# Patient Record
Sex: Female | Born: 1985 | Race: White | Hispanic: No | Marital: Single | Smoking: Never smoker
Health system: Southern US, Community
[De-identification: ages and names within clinical notes are randomized; demographics above are authoritative.]

## PROBLEM LIST (undated history)

## (undated) ENCOUNTER — Inpatient Hospital Stay (HOSPITAL_COMMUNITY): Payer: Self-pay

## (undated) DIAGNOSIS — S82899A Other fracture of unspecified lower leg, initial encounter for closed fracture: Secondary | ICD-10-CM

## (undated) DIAGNOSIS — J45909 Unspecified asthma, uncomplicated: Secondary | ICD-10-CM

## (undated) DIAGNOSIS — S0230XA Fracture of orbital floor, unspecified side, initial encounter for closed fracture: Secondary | ICD-10-CM

## (undated) DIAGNOSIS — S025XXA Fracture of tooth (traumatic), initial encounter for closed fracture: Secondary | ICD-10-CM

## (undated) DIAGNOSIS — G43909 Migraine, unspecified, not intractable, without status migrainosus: Secondary | ICD-10-CM

## (undated) DIAGNOSIS — R7611 Nonspecific reaction to tuberculin skin test without active tuberculosis: Secondary | ICD-10-CM

## (undated) DIAGNOSIS — Z8709 Personal history of other diseases of the respiratory system: Secondary | ICD-10-CM

## (undated) HISTORY — PX: WISDOM TOOTH EXTRACTION: SHX21

## (undated) HISTORY — PX: ANKLE FRACTURE SURGERY: SHX122

## (undated) HISTORY — DX: Nonspecific reaction to tuberculin skin test without active tuberculosis: R76.11

---
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
## 2006-09-11 ENCOUNTER — Ambulatory Visit (HOSPITAL_COMMUNITY): Admission: RE | Admit: 2006-09-11 | Discharge: 2006-09-11 | Payer: Self-pay | Admitting: Family Medicine

## 2006-09-23 ENCOUNTER — Ambulatory Visit (HOSPITAL_COMMUNITY): Admission: RE | Admit: 2006-09-23 | Discharge: 2006-09-23 | Payer: Self-pay | Admitting: Obstetrics & Gynecology

## 2006-11-30 ENCOUNTER — Inpatient Hospital Stay (HOSPITAL_COMMUNITY): Admission: AD | Admit: 2006-11-30 | Discharge: 2006-11-30 | Payer: Self-pay | Admitting: Obstetrics & Gynecology

## 2006-11-30 ENCOUNTER — Ambulatory Visit: Payer: Self-pay | Admitting: Obstetrics and Gynecology

## 2007-01-09 ENCOUNTER — Inpatient Hospital Stay (HOSPITAL_COMMUNITY): Admission: AD | Admit: 2007-01-09 | Discharge: 2007-01-09 | Payer: Self-pay | Admitting: Gynecology

## 2007-02-15 ENCOUNTER — Ambulatory Visit: Payer: Self-pay | Admitting: Obstetrics & Gynecology

## 2007-02-17 ENCOUNTER — Inpatient Hospital Stay (HOSPITAL_COMMUNITY): Admission: AD | Admit: 2007-02-17 | Discharge: 2007-02-19 | Payer: Self-pay | Admitting: Obstetrics & Gynecology

## 2007-02-17 ENCOUNTER — Ambulatory Visit: Payer: Self-pay | Admitting: Family

## 2007-03-25 ENCOUNTER — Inpatient Hospital Stay (HOSPITAL_COMMUNITY): Admission: EM | Admit: 2007-03-25 | Discharge: 2007-03-27 | Payer: Self-pay | Admitting: Emergency Medicine

## 2007-09-12 ENCOUNTER — Emergency Department (HOSPITAL_COMMUNITY): Admission: EM | Admit: 2007-09-12 | Discharge: 2007-09-13 | Payer: Self-pay | Admitting: Emergency Medicine

## 2008-06-16 ENCOUNTER — Emergency Department (HOSPITAL_BASED_OUTPATIENT_CLINIC_OR_DEPARTMENT_OTHER): Admission: EM | Admit: 2008-06-16 | Discharge: 2008-06-16 | Payer: Self-pay | Admitting: Emergency Medicine

## 2008-06-18 ENCOUNTER — Ambulatory Visit: Payer: Self-pay | Admitting: Diagnostic Radiology

## 2008-06-18 ENCOUNTER — Emergency Department (HOSPITAL_BASED_OUTPATIENT_CLINIC_OR_DEPARTMENT_OTHER): Admission: EM | Admit: 2008-06-18 | Discharge: 2008-06-18 | Payer: Self-pay | Admitting: Emergency Medicine

## 2008-08-13 ENCOUNTER — Emergency Department (HOSPITAL_COMMUNITY): Admission: EM | Admit: 2008-08-13 | Discharge: 2008-08-13 | Payer: Self-pay | Admitting: Emergency Medicine

## 2008-08-15 ENCOUNTER — Emergency Department (HOSPITAL_BASED_OUTPATIENT_CLINIC_OR_DEPARTMENT_OTHER): Admission: EM | Admit: 2008-08-15 | Discharge: 2008-08-15 | Payer: Self-pay | Admitting: Emergency Medicine

## 2008-08-15 ENCOUNTER — Ambulatory Visit: Payer: Self-pay | Admitting: Diagnostic Radiology

## 2008-09-01 ENCOUNTER — Emergency Department (HOSPITAL_BASED_OUTPATIENT_CLINIC_OR_DEPARTMENT_OTHER): Admission: EM | Admit: 2008-09-01 | Discharge: 2008-09-01 | Payer: Self-pay | Admitting: Emergency Medicine

## 2009-01-06 ENCOUNTER — Emergency Department (HOSPITAL_BASED_OUTPATIENT_CLINIC_OR_DEPARTMENT_OTHER): Admission: EM | Admit: 2009-01-06 | Discharge: 2009-01-06 | Payer: Self-pay | Admitting: Emergency Medicine

## 2009-01-06 ENCOUNTER — Ambulatory Visit: Payer: Self-pay | Admitting: Diagnostic Radiology

## 2009-04-20 ENCOUNTER — Emergency Department (HOSPITAL_COMMUNITY): Admission: EM | Admit: 2009-04-20 | Discharge: 2009-04-20 | Payer: Self-pay | Admitting: Emergency Medicine

## 2009-07-02 ENCOUNTER — Ambulatory Visit: Payer: Self-pay | Admitting: Radiology

## 2009-07-02 ENCOUNTER — Emergency Department (HOSPITAL_BASED_OUTPATIENT_CLINIC_OR_DEPARTMENT_OTHER): Admission: EM | Admit: 2009-07-02 | Discharge: 2009-07-02 | Payer: Self-pay | Admitting: Emergency Medicine

## 2010-04-22 LAB — URINALYSIS, ROUTINE W REFLEX MICROSCOPIC
Bilirubin Urine: NEGATIVE
Glucose, UA: NEGATIVE mg/dL
Ketones, ur: NEGATIVE mg/dL
Protein, ur: NEGATIVE mg/dL

## 2010-04-22 LAB — DIFFERENTIAL
Basophils Absolute: 0.1 10*3/uL (ref 0.0–0.1)
Basophils Relative: 1 % (ref 0–1)
Eosinophils Absolute: 0.1 10*3/uL (ref 0.0–0.7)
Eosinophils Relative: 1 % (ref 0–5)
Lymphocytes Relative: 27 % (ref 12–46)

## 2010-04-22 LAB — CBC
HCT: 40.9 % (ref 36.0–46.0)
MCHC: 34.2 g/dL (ref 30.0–36.0)
Platelets: 302 10*3/uL (ref 150–400)
RDW: 12.5 % (ref 11.5–15.5)

## 2010-04-22 LAB — PREGNANCY, URINE: Preg Test, Ur: NEGATIVE

## 2010-04-22 LAB — HEMOCCULT GUIAC POC 1CARD (OFFICE): Fecal Occult Bld: NEGATIVE

## 2010-04-27 LAB — URINALYSIS, ROUTINE W REFLEX MICROSCOPIC
Bilirubin Urine: NEGATIVE
Glucose, UA: NEGATIVE mg/dL
Hgb urine dipstick: NEGATIVE
Ketones, ur: NEGATIVE mg/dL
Protein, ur: NEGATIVE mg/dL
pH: 6 (ref 5.0–8.0)

## 2010-04-27 LAB — URINE MICROSCOPIC-ADD ON

## 2010-04-30 LAB — URINALYSIS, ROUTINE W REFLEX MICROSCOPIC
Ketones, ur: NEGATIVE mg/dL
Protein, ur: NEGATIVE mg/dL
Urobilinogen, UA: 1 mg/dL (ref 0.0–1.0)

## 2010-04-30 LAB — URINE MICROSCOPIC-ADD ON

## 2010-04-30 LAB — RAPID STREP SCREEN (MED CTR MEBANE ONLY): Streptococcus, Group A Screen (Direct): POSITIVE — AB

## 2010-06-04 NOTE — H&P (Signed)
[name], Munoz              ACCOUNT NO.:  [id number]   MEDICAL RECORD NO.:  [id number]          PATIENT TYPE:  EMS   LOCATION:  ED                           FACILITY:  [facility]   PHYSICIAN:  Lonia Blood, M.D.DATE OF BIRTH:  11/23/1985   DATE OF ADMISSION:  03/24/2007  DATE OF DISCHARGE:                              HISTORY & PHYSICAL   CHIEF COMPLAINT:  Intractable vomiting, diarrhea and orthostasis.   HISTORY OF PRESENT ILLNESS:  Helen Munoz is a pleasant, 25-year-  old who is status post spontaneous vaginal delivery of a healthy child  approximately 1 month ago.  She has been doing well in the post delivery  period.  For the past 24 hours, however, she has developed the acute-  onset of intractable nausea, vomiting and watery diarrhea.  She states  that she has vomited in excess of 10 times in the last 24 hours.  There  has been no hematemesis.  The patient complains of diffuse abdominal  cramps, but no focal abdominal pain.  Diarrhea is described as almost  pure water with no hematochezia or melena.  There has been no  appreciated fever, per her report.  There has been no shortness of  breath, chest pain or headache.  The patient does state that she feels  dizzy when she stands up abruptly.  There has been no syncope.  There  have been no sick contacts to the patient's knowledge.  She has had no  previous similar episodes.   REVIEW OF SYSTEMS:  Comprehensive review of systems is unremarkable with  exception of the multiple positive elements noted in the history of  present illness above.   PAST MEDICAL HISTORY:  1. Asthma.  2. Status post wisdom tooth extraction.  3. History of abnormal Pap smear.  4. History of gonorrhea, Chlamydia and Trichomonas vaginitis.  5. History of migraine headache.  6. Status post vaginal delivery 1 month ago.  7. Depression with history of suicide attempt requiring inpatient      hospitalization.   MEDICATIONS:  None.   ALLERGIES:  No known drug allergies.   FAMILY HISTORY:  Noncontributory to this admission.   SOCIAL HISTORY:  The patient does not smoke.  She does not drink.  She  lives in the [place] area.   LABORATORY DATA AND X-RAY FINDINGS:  BMET is unremarkable.  CBC is  unremarkable.  Urine pregnancy test is negative.  Urinalysis reveals  trace leukocyte esterase and 0-2 wbc's.   PHYSICAL EXAMINATION:  VITAL SIGNS:  Temperature 100.9, blood pressure  105/44, heart rate 142, respiratory 22, 97% on room air.  After 4 L of  fluid, blood pressure is actually 84/39.  GENERAL:  Well-developed, well-nourished, obese female in no acute  respiratory distress.  HEENT:  Normocephalic, atraumatic.  Pupils equal round, react to light  accommodation.  Extraocular muscles intact bilaterally.  NECK:  No JVD.  LUNGS:  Clear to auscultation bilaterally without wheezes or rhonchi.  CARDIOVASCULAR:  Regular rate and rhythm, but tachycardic without gallop  or rub.  ABDOMEN:  Diffusely tender to palpation with no focal tenderness.  No  mass.  No rebound or ascites.  Nondistended.  EXTREMITIES:  No significant cyanosis, clubbing, edema of bilateral  lower extremities.  NEUROLOGIC:  Nonfocal neurologic exam.   IMPRESSION/PLAN:  1. Probable viral gastroenteritis.  Helen Munoz is suffering with what      sounds like a typical viral gastroenteritis.  All efforts have been      made to treat her and discharge her from the emergency room.      Despite 4 L total of bodily fluid, however, the patient remains      orthostatic and hypotensive.  She is therefore being admitted for      aggressive intravenous fluid volume resuscitation with crystalloid      intravenous fluids.  We will treat the patient's symptoms and      follow her clinical course.  2. Asthma.  No active complications.  3. Recent vaginal delivery.  The patient reports no complications.      Lonia Blood, M.D.  Electronically Signed      JTM/MEDQ  D:  03/25/2007  T:  03/25/2007  Job:  [id number]

## 2010-06-07 NOTE — H&P (Signed)
[facility]  Patient:    KILLIAN, RESS Visit Number: [id number] MRN: [id number]          Service Type: PSY Location: 100 0100 01 Attending Physician:  Veneta Penton. Dictated by:   Veneta Penton, M.D. Admit Date:  04/27/2001                     Psychiatric Admission Assessment  DATE OF ADMISSION:  April 27, 2001.  REASON FOR ADMISSION:  This 25 year old white female was admitted complaining of depression, status post overdose with 24 Advil as a suicide attempt.  HISTORY OF PRESENT ILLNESS:  The patient complained of an increasingly depressed, irritable, and angry mood most of the day nearly every day, increasing over the past several months, along with anhedonia, decreased hygiene, decreased school performance and decreased appetite.  She admits to feelings of hopelessness, helplessness, worthlessness, decreased concentration and energy level, increased symptoms of fatigue, and recurrent thoughts of death.  She is unable to contract for safety at this time.  She denies any psychosocial stressors other than frequent arguments with her mother.  PAST PSYCHIATRIC HISTORY:  Significant for attention deficit hyperactivity disorder and oppositional-defiant disorder.  DRUG AND ALCOHOL ABUSE HISTORY:  She denies any use of tobacco, street drugs or alcohol.  PAST MEDICAL HISTORY:  Significant for a fractured right wrist at age 65.  She has no known drug allergies or sensitivities.   She is currently taking no medication.  STRENGTHS AND ASSETS:  Her mother is supportive of her.  FAMILY AND SOCIAL HISTORY:  The patient lives with her mother.  Father is an alcoholic and continues to actively drink.  The patient is currently in the 8th grade and doing poorly.  MENTAL STATUS EXAMINATION:  The patient presents as well-developed, well- nourished adolescent white female, who is alert, oriented x 4, cooperative with the evaluation and whose  appearance is compatible with her stated age. Her affect and mood are depressed and irritable.  Her concentration is decreased.  She displays poor impulse control, decreased concentration and attention span.  She is easily distracted by extraneous stimuli.  Her immediate immediate recall, short term memory and remote memory are intact. Similarities and differences are within normal limits and she is able to abstract the proverbs.  Her thought process are goal directed.  ADMISSION DIAGNOSES: Axis I:    1. Major depression, single episode, severe, without psychosis.            2. Attention deficit hyperactivity disorder, combined type. Axis II:   Rule out learning disorder not otherwise specified. Axis III:  None. Axis IV:   Severe. Axis V:    Code 20.  FURTHER EVALUATION AND TREATMENT RECOMMENDATIONS:  ESTIMATED LENGTH OF STAY ON THE INPATIENT UNIT:  Five to seven days.  INITIAL DISCHARGE PLAN:  To discharge the patient to home.  INITIAL PLAN OF CARE:  To begin the patient on a trial of Effexor XR to attempt her symptoms of depression as well as her ADHD symptoms. Psychotherapy will focus on improving the patients impulse control, decreasing cognitive distortions and potential for self harm.  A laboratory workup will also be initiated to rule out any other medical problems contributing to her symptomatology. Dictated by:   Veneta Penton, M.D. Attending Physician:  Veneta Penton DD:  04/28/01 TD:  04/28/01 Job: [id number]

## 2010-06-07 NOTE — Discharge Summary (Signed)
Helen Munoz, Helen Munoz              ACCOUNT NO.:  [id number]   MEDICAL RECORD NO.:  [id number]          PATIENT TYPE:  INP   LOCATION:  1335                         FACILITY:  [facility]   PHYSICIAN:  Mobolaji B. Bakare, M.D.DATE OF BIRTH:  09-Sep-1985   DATE OF ADMISSION:  03/24/2007  DATE OF DISCHARGE:  03/27/2007                               DISCHARGE SUMMARY   PRIMARY CARE PHYSICIAN:  Unassigned.   FINAL DIAGNOSES:  1. Acute gastroenteritis, likely viral.  2. Hypotension.   BRIEF HISTORY:  Helen Munoz is a 25 year old lady who presented with  intractable nausea, vomiting, and diarrhea.  She was orthostatic and  hypotensive at the time of admission.  The patient was seen in the  emergency room with a temperature of 100.9, blood pressure 105/44,  tachycardic with a heart rate of 142.  She was saturating at 95% on room  air.  The patient has had several episodes of nausea, vomiting, and  diarrhea in the preceding 24 hours.  She was given IV fluids in the  emergency room and resuscitated aggressively.  Blood pressure improved,  and patient was admitted for further observation.   Patient was started on a clear-liquid diet.  This was subsequently  advanced to a regular diet.  She tolerated it quite well.  The patient  was stable enough for discharge on April 27, 2007.   DISCHARGE CONDITION:  Stable.  Blood pressure 109/69, O2 sats 95% on  room air.   DISCHARGE MEDICATIONS:  None.   Patient was advised to drink lots of water to replace the fluid loss.      Mobolaji B. Corky Downs, M.D.  Electronically Signed     MBB/MEDQ  D:  05/04/2007  T:  05/04/2007  Job:  [id number]

## 2010-06-07 NOTE — Discharge Summary (Signed)
[facility]  Patient:    Helen Munoz, Helen Munoz Visit Number: [id number] MRN: [id number]          Service Type: PSY Location: 100 0100 01 Attending Physician:  Veneta Penton. Dictated by:   Veneta Penton, M.D. Admit Date:  04/27/2001 Discharge Date: 05/05/2001                             Discharge Summary  REASON FOR ADMISSION:  This 25 year old white female was admitted complaining of depression status post overdose with 24 Advil as a suicide attempt.  For further history of present illness, please see the patients psychiatric admission assessment.  PHYSICAL EXAMINATION:  At the time of admission was significant for obesity and a history of allergic rhinitis.  She had an otherwise unremarkable physical examination.  LABORATORY EXAMINATION:  The patient underwent a laboratory work-up to rule out any other medical problems contributing to her symptomatology.  A free T4 and TSH were within normal limits.  Hepatic panel was within normal limits.  A GGT was within normal limits.  A UA showed a large amount of hemoglobin consistent with the patient having her monthly menstrual flow.  A urine probe for gonorrhea and chlamydia were negative.  An RPR was nonreactive.  Basic metabolic panel was within normal limits.  CBC showed a white count of 17.5 thousand with a left shift on admission.  The patient received no x-rays, no special procedures, no additional consultations.  She sustained no complications during the course of this hospitalization.  HOSPITAL COURSE:  During the course of the hospitalization, the patient had an episode of flu syndrome consistent with her elevated white count on admission. She recovered from this after a period of 48 hours and at the present time is afebrile and without any complaints of flu symptoms.  On admission, her affect and mood were depressed and irritable.  Her concentration was decreased.  She displayed poor  impulse control, decreased concentration and attention span and was easily distracted by extraneous stimuli.  She was begun on a trial of Effexor XR, complained of some difficulty with insomnia to which trazodone was added.  She tolerated these medications well without side effects.  At the time of discharge, she is actively participating in all aspects of the therapeutic treatment program, denies any suicidal or homicidal ideation.  She is motivated for outpatient therapy and consequently is felt to have reached her maximum benefits of hospitalization and is ready for discharge to a less restricted alternative setting.  CONDITION ON DISCHARGE:  Improved  FINAL DIAGNOSIS: Axis I:    1. Major depression, single episode, severe, without psychosis.            2. Attention deficit hyperactivity disorder, combined type. Axis II:   Rule out learning disorder not otherwise specified. Axis III:  Obesity. Axis IV:   Severe. Axis V:    Code 20 on admission, code 30 on discharge.  FURTHER EVALUATION AND TREATMENT RECOMMENDATIONS: 1. The patient is discharged to home. 2. She is discharged on an unrestricted level of activity and a regular diet. 3. She will follow up with her outpatient psychiatrist for all further aspects    of her psychiatric care and consequently I will sign off on the case at    this time.  DISCHARGE MEDICATIONS: 1. Trazodone 50 mg p.o. q.h.s. 2. Effexor XR 75 mg p.o. q.a.m. Dictated by:   Veneta Penton, M.D.  Attending Physician:  Veneta Penton DD:  05/05/01 TD:  05/05/01 Job: [id number]

## 2010-09-28 ENCOUNTER — Emergency Department (HOSPITAL_BASED_OUTPATIENT_CLINIC_OR_DEPARTMENT_OTHER)
Admission: EM | Admit: 2010-09-28 | Discharge: 2010-09-28 | Disposition: A | Discharge status: Home / Self Care | Payer: Self-pay | Attending: Emergency Medicine | Admitting: Emergency Medicine

## 2010-09-28 ENCOUNTER — Encounter: Payer: Self-pay | Admitting: Emergency Medicine

## 2010-09-28 DIAGNOSIS — H60399 Other infective otitis externa, unspecified ear: Secondary | ICD-10-CM | POA: Insufficient documentation

## 2010-09-28 DIAGNOSIS — H9209 Otalgia, unspecified ear: Secondary | ICD-10-CM | POA: Insufficient documentation

## 2010-09-28 MED ORDER — NEOMYCIN-POLYMYXIN-HC 3.5-10000-1 OT SUSP: 3.0000 [drp] | Freq: Four times a day (QID) | OTIC | Status: AC

## 2010-09-28 NOTE — ED Notes (Signed)
Pt reports bleeding from R Ear denies any trauma

## 2010-09-28 NOTE — ED Notes (Signed)
Care plan reviewed with use of meds

## 2010-09-28 NOTE — ED Provider Notes (Signed)
History   16y F with R ear pain. Mild ache in R ear for past 2d. Today noticed something wet in ear and appeared to be blood. Placed qtip in ear and noticed blood. Denies trauma. No fever or chills. No change in hearing. No tinnittus. Denies hx of diabetes. No other complaints.  CSN: [id number] Arrival date & time: 09/28/2010 11:40 AM  Chief Complaint  Patient presents with  . Otalgia    Pt reports bleeding from R ear x 1 Mod amount of pain   The history is provided by the patient. No language interpreter was used.    History reviewed. No pertinent past medical history.  History reviewed. No pertinent past surgical history.  History reviewed. No pertinent family history.  History  Substance Use Topics  . Smoking status: Never Smoker   . Smokeless tobacco: Not on file  . Alcohol Use: No    OB History    Grav Para Term Preterm Abortions TAB SAB Ect Mult Living                  Review of Systems  Constitutional: Negative for fever and chills.  HENT: Positive for ear pain and ear discharge. Negative for hearing loss, nosebleeds, congestion, sneezing, neck pain and neck stiffness.   Eyes: Negative.   Respiratory: Negative.   Cardiovascular: Negative.   Gastrointestinal: Negative.   Musculoskeletal: Negative.   Neurological: Negative for dizziness, light-headedness and headaches.  All other systems reviewed and are negative.    Physical Exam  BP 110/60  Pulse 77  Temp(Src) 98.3 F (36.8 C) (Oral)  Resp 18  SpO2 96%  Physical Exam  HENT:  Left Ear: External ear normal.  Nose: Nose normal.  Mouth/Throat: Oropharynx is clear and moist.       Proximal-to-mid R external auditory canal macerated with what appearing to dried blood. TM easily visualized and appearing grossly normal without perf or fluid. L ear exam normal.  Eyes: Conjunctivae are normal. Pupils are equal, round, and reactive to light.  Neck: Normal range of motion. Neck supple.  Cardiovascular: Normal  rate, regular rhythm and normal heart sounds.   Pulmonary/Chest: Effort normal and breath sounds normal. No respiratory distress.  Lymphadenopathy:    She has no cervical adenopathy.  Neurological: She is alert.  Skin: Skin is warm and dry.  Psychiatric: She has a normal mood and affect.    ED Course  Procedures  MDM 25yF with exam consistent w/ R otitis externa. CInically appears well otherwise.  Plan otic abx. Discussed signs/symptoms to return for immediate re-eval.     Raeford Razor, MD 09/30/10 1040

## 2010-10-10 LAB — RH IMMUNE GLOB WKUP(>/=20WKS)(NOT WOMEN'S HOSP): Fetal Screen: NEGATIVE

## 2010-10-10 LAB — CBC
HCT: 29.6 — ABNORMAL LOW
Hemoglobin: 10.3 — ABNORMAL LOW
Hemoglobin: 9.1 — ABNORMAL LOW
MCHC: 34.7
MCV: 85.7
MCV: 87.2
Platelets: 224
RBC: 3 — ABNORMAL LOW
RDW: 14
RDW: 14.1
WBC: 14.8 — ABNORMAL HIGH

## 2010-10-14 LAB — DIFFERENTIAL
Eosinophils Absolute: 0
Eosinophils Relative: 0
Lymphocytes Relative: 15
Lymphs Abs: 1.4
Monocytes Absolute: 0.3
Monocytes Relative: 3

## 2010-10-14 LAB — CBC
HCT: 36.9
Hemoglobin: 12.1
MCV: 82.7
RBC: 4.47
WBC: 9.7

## 2010-10-14 LAB — URINALYSIS, ROUTINE W REFLEX MICROSCOPIC
Bilirubin Urine: NEGATIVE
Glucose, UA: NEGATIVE
Hgb urine dipstick: NEGATIVE
Nitrite: NEGATIVE
Specific Gravity, Urine: 1.03
pH: 6

## 2010-10-14 LAB — CLOSTRIDIUM DIFFICILE EIA

## 2010-10-14 LAB — PREGNANCY, URINE: Preg Test, Ur: NEGATIVE

## 2010-10-14 LAB — STOOL CULTURE

## 2010-10-14 LAB — BASIC METABOLIC PANEL
BUN: 15
CO2: 22
Calcium: 7.6 — ABNORMAL LOW
Chloride: 105
Creatinine, Ser: 0.68
GFR calc Af Amer: >60
GFR calc non Af Amer: >60
Glucose, Bld: 95
Potassium: 3.9
Sodium: 135

## 2010-10-14 LAB — URINE MICROSCOPIC-ADD ON

## 2010-10-14 LAB — FECAL LACTOFERRIN, QUANT

## 2010-10-25 LAB — URINALYSIS, ROUTINE W REFLEX MICROSCOPIC
Bilirubin Urine: NEGATIVE
Hgb urine dipstick: NEGATIVE
Protein, ur: 30 — AB
Urobilinogen, UA: 0.2

## 2010-10-25 LAB — STREP B DNA PROBE

## 2010-10-29 LAB — URINALYSIS, ROUTINE W REFLEX MICROSCOPIC
Ketones, ur: NEGATIVE
Nitrite: NEGATIVE
Protein, ur: NEGATIVE

## 2010-10-29 LAB — WET PREP, GENITAL

## 2010-10-29 LAB — KLEIHAUER-BETKE STAIN
Fetal Cells %: 0
Quantitation Fetal Hemoglobin: 0

## 2011-01-10 ENCOUNTER — Emergency Department (HOSPITAL_BASED_OUTPATIENT_CLINIC_OR_DEPARTMENT_OTHER)
Admission: EM | Admit: 2011-01-10 | Discharge: 2011-01-11 | Disposition: A | Discharge status: Home / Self Care | Payer: Medicaid Other | Attending: Emergency Medicine | Admitting: Emergency Medicine

## 2011-01-10 ENCOUNTER — Encounter (HOSPITAL_BASED_OUTPATIENT_CLINIC_OR_DEPARTMENT_OTHER): Payer: Self-pay | Admitting: *Deleted

## 2011-01-10 DIAGNOSIS — A499 Bacterial infection, unspecified: Secondary | ICD-10-CM | POA: Insufficient documentation

## 2011-01-10 DIAGNOSIS — N920 Excessive and frequent menstruation with regular cycle: Secondary | ICD-10-CM | POA: Insufficient documentation

## 2011-01-10 DIAGNOSIS — N760 Acute vaginitis: Secondary | ICD-10-CM | POA: Insufficient documentation

## 2011-01-10 DIAGNOSIS — B9689 Other specified bacterial agents as the cause of diseases classified elsewhere: Secondary | ICD-10-CM | POA: Insufficient documentation

## 2011-01-10 NOTE — ED Notes (Signed)
Co left and right lower sided abdominal pains and low back pain for approx 1 week. Constant in nature. C/o urinary frequency. C/o vaginal discharge-mucus in description. Also c/o moderate bleeding that began same time with small clots. States she has not bled in 6 years d/t bc method.

## 2011-01-11 LAB — URINALYSIS, ROUTINE W REFLEX MICROSCOPIC
Bilirubin Urine: NEGATIVE
Nitrite: NEGATIVE
Protein, ur: NEGATIVE mg/dL
Specific Gravity, Urine: 1.024 (ref 1.005–1.030)
Urobilinogen, UA: 1 mg/dL (ref 0.0–1.0)

## 2011-01-11 LAB — URINE MICROSCOPIC-ADD ON

## 2011-01-11 LAB — WET PREP, GENITAL: Yeast Wet Prep HPF POC: NONE SEEN

## 2011-01-11 MED ORDER — METRONIDAZOLE 500 MG PO TABS: 500.0000 mg | ORAL_TABLET | Freq: Two times a day (BID) | ORAL | Status: AC

## 2011-01-11 MED ORDER — TRAMADOL HCL 50 MG PO TABS: 50.0000 mg | ORAL_TABLET | Freq: Four times a day (QID) | ORAL | Status: AC | PRN

## 2011-01-11 NOTE — ED Provider Notes (Signed)
History     CSN: [id number]  Arrival date & time 01/10/11  2316   First MD Initiated Contact with Patient 01/11/11 0000      Chief Complaint  Patient presents with  . Abdominal Pain    (Consider location/radiation/quality/duration/timing/severity/associated sxs/prior treatment) Patient is a 25 y.o. female presenting with abdominal pain. The history is provided by the patient. No language interpreter was used.  Abdominal Pain The primary symptoms of the illness include abdominal pain, vaginal discharge and vaginal bleeding. The primary symptoms of the illness do not include fever, fatigue, shortness of breath, nausea, vomiting, diarrhea, hematemesis, hematochezia or dysuria. The current episode started more than 2 days ago (9 days ago). The onset of the illness was gradual. The problem has not changed since onset. The abdominal pain is located in the suprapubic region. The abdominal pain does not radiate. The severity of the abdominal pain is 9/10. The abdominal pain is relieved by nothing. Exacerbated by: nothing.  The vaginal discharge is not associated with dysuria.  Associated with: nothing. The patient states that she believes she is currently not pregnant. The patient has not had a change in bowel habit. Risk factors: none. Symptoms associated with the illness do not include chills, anorexia, diaphoresis, heartburn, constipation, urgency, hematuria, frequency or back pain. Significant associated medical issues do not include PUD or diverticulitis.  Does not know why she started bleeding a week ago because she has implanted contraception in her left forearm.  She has had a device for 6 years.  It was changed in May and she never previously had bleeding.  Discharge is clear and colorless.    History reviewed. No pertinent past medical history.  History reviewed. No pertinent past surgical history.  No family history on file.  History  Substance Use Topics  . Smoking status: Never  Smoker   . Smokeless tobacco: Not on file  . Alcohol Use: No    OB History    Grav Para Term Preterm Abortions TAB SAB Ect Mult Living                  Review of Systems  Constitutional: Negative for fever, chills, diaphoresis and fatigue.  HENT: Negative for facial swelling.   Respiratory: Negative for shortness of breath.   Cardiovascular: Negative for chest pain.  Gastrointestinal: Positive for abdominal pain. Negative for heartburn, nausea, vomiting, diarrhea, constipation, hematochezia, anorexia and hematemesis.  Genitourinary: Positive for vaginal bleeding and vaginal discharge. Negative for dysuria, urgency, frequency, hematuria and difficulty urinating.  Musculoskeletal: Negative for back pain.  Neurological: Negative for dizziness.  Hematological: Negative.   Psychiatric/Behavioral: Negative.     Allergies  Review of patient's allergies indicates no known allergies.  Home Medications   Current Outpatient Rx  Name Route Sig Dispense Refill  . METRONIDAZOLE 500 MG PO TABS Oral Take 1 tablet (500 mg total) by mouth 2 (two) times daily. 14 tablet 0  . TRAMADOL HCL 50 MG PO TABS Oral Take 1 tablet (50 mg total) by mouth every 6 (six) hours as needed for pain. Maximum dose= 8 tablets per day 9 tablet 0    BP 111/62  Pulse 90  Temp(Src) 98.4 F (36.9 C) (Oral)  Resp 18  Ht 5\' 4"  (1.626 m)  Wt 200 lb (90.719 kg)  BMI 34.33 kg/m2  SpO2 100%  LMP 01/10/2011  Physical Exam  Constitutional: She is oriented to person, place, and time. She appears well-developed and well-nourished.  HENT:  Head: Normocephalic and atraumatic.  Mouth/Throat: Oropharynx is clear and moist. No oropharyngeal exudate.  Eyes: Conjunctivae are normal. Pupils are equal, round, and reactive to light.  Neck: Normal range of motion. Neck supple.  Cardiovascular: Normal rate and regular rhythm.   Pulmonary/Chest: Effort normal and breath sounds normal.  Abdominal: Soft. Bowel sounds are normal.  She exhibits no mass. There is no tenderness. There is no rebound and no guarding.  Genitourinary: Cervix exhibits no motion tenderness. Right adnexum displays no mass and no tenderness. Left adnexum displays no mass and no tenderness.       Chaperone present.  Scant bleeding per cervical os os closed  Musculoskeletal: Normal range of motion. She exhibits no edema.  Lymphadenopathy:    She has no cervical adenopathy.  Neurological: She is alert and oriented to person, place, and time.  Skin: Skin is warm and dry. No rash noted.  Psychiatric: Thought content normal.    ED Course  Procedures (including critical care time)  Labs Reviewed  URINALYSIS, ROUTINE W REFLEX MICROSCOPIC - Abnormal; Notable for the following:    Hgb urine dipstick SMALL (*)    All other components within normal limits  WET PREP, GENITAL - Abnormal; Notable for the following:    Clue Cells, Wet Prep MODERATE (*)    WBC, Wet Prep HPF POC TOO NUMEROUS TO COUNT (*)    All other components within normal limits  PREGNANCY, URINE  URINE MICROSCOPIC-ADD ON  GC/CHLAMYDIA PROBE AMP, GENITAL   No results found.   1. Bacterial vaginosis   2. Menorrhagia       MDM  Patient advised she will be called with positive gonorrhea or chlamydia results.  Rx given for pain and bacterial vaginosis.  She will need to follow up with her gynecologist regarding hormone levels and break through bleeding on implanted contraception and will need her annual pap smear.  Patient verbalizes understanding and agrees to follow up         Jakya Dovidio Smitty Cords, MD 01/11/11 [phone]

## 2011-01-13 LAB — GC/CHLAMYDIA PROBE AMP, GENITAL: GC Probe Amp, Genital: NEGATIVE

## 2011-01-14 NOTE — ED Notes (Signed)
+  Chlamydia Chart sent to EDP office for review.  

## 2011-01-17 NOTE — ED Notes (Signed)
Rx for Doxycyline 100 mg BID x 7 days.Follow up with PCP per Felicie Morn.

## 2011-01-22 NOTE — ED Notes (Signed)
Unable to contact via phone. Letter faxed to epic address.

## 2011-08-27 ENCOUNTER — Encounter (HOSPITAL_BASED_OUTPATIENT_CLINIC_OR_DEPARTMENT_OTHER): Payer: Self-pay

## 2011-08-27 ENCOUNTER — Emergency Department (HOSPITAL_BASED_OUTPATIENT_CLINIC_OR_DEPARTMENT_OTHER)
Admission: EM | Admit: 2011-08-27 | Discharge: 2011-08-27 | Disposition: A | Discharge status: Home / Self Care | Payer: BC Managed Care – PPO | Attending: Emergency Medicine | Admitting: Emergency Medicine

## 2011-08-27 DIAGNOSIS — N938 Other specified abnormal uterine and vaginal bleeding: Secondary | ICD-10-CM | POA: Insufficient documentation

## 2011-08-27 DIAGNOSIS — N949 Unspecified condition associated with female genital organs and menstrual cycle: Secondary | ICD-10-CM | POA: Insufficient documentation

## 2011-08-27 LAB — URINALYSIS, ROUTINE W REFLEX MICROSCOPIC
Bilirubin Urine: NEGATIVE
Hgb urine dipstick: NEGATIVE
Ketones, ur: NEGATIVE mg/dL
Protein, ur: NEGATIVE mg/dL
Urobilinogen, UA: 0.2 mg/dL (ref 0.0–1.0)

## 2011-08-27 LAB — PREGNANCY, URINE: Preg Test, Ur: NEGATIVE

## 2011-08-27 LAB — WET PREP, GENITAL

## 2011-08-27 MED ORDER — MEDROXYPROGESTERONE ACETATE 5 MG PO TABS: 5.0000 mg | ORAL_TABLET | Freq: Every day | ORAL | Status: DC

## 2011-08-27 MED ORDER — TRAMADOL HCL 50 MG PO TABS: 50.0000 mg | ORAL_TABLET | Freq: Four times a day (QID) | ORAL | Status: AC | PRN

## 2011-08-27 NOTE — ED Provider Notes (Signed)
Medical screening examination/treatment/procedure(s) were performed by non-physician practitioner and as supervising physician I was immediately available for consultation/collaboration.   Carleene Cooper III, MD 08/27/11 2209

## 2011-08-27 NOTE — ED Provider Notes (Signed)
History     CSN: [id number]  Arrival date & time 08/27/11  1602   First MD Initiated Contact with Patient 08/27/11 1802      Chief Complaint  Patient presents with  . Abdominal Pain  . Vaginal Bleeding    (Consider location/radiation/quality/duration/timing/severity/associated sxs/prior treatment) HPI  26 y/o female INAD c/o vaginal bleeding x14 days at 3 pads per days with lower midline abdominal cramping at about 8/10 on the pain scale, moderate relief with Motrin.Implanon replaced in 04/210, pt has not menstruated regularly in last 6 years. Patient denies any other abnormal vaginal discharge, any change in bowel or bladder habits, lightheaded sensation on standing shortness of breath or palpitations.   History reviewed. No pertinent past medical history.  History reviewed. No pertinent past surgical history.  No family history on file.  History  Substance Use Topics  . Smoking status: Never Smoker   . Smokeless tobacco: Not on file  . Alcohol Use: No    OB History    Grav Para Term Preterm Abortions TAB SAB Ect Mult Living                  Review of Systems  Gastrointestinal: Positive for abdominal pain.  Genitourinary: Positive for menstrual problem.  All other systems reviewed and are negative.    Allergies  Review of patient's allergies indicates no known allergies.  Home Medications   Current Outpatient Rx  Name Route Sig Dispense Refill  . BUTALBITAL-ACETAMINOPHEN 50-325 MG PO TABS Oral Take 1 tablet by mouth every 6 (six) hours.      BP 122/71  Pulse 78  Temp 98.1 F (36.7 C) (Oral)  Resp 16  Ht 5\' 3"  (1.6 m)  Wt 223 lb (101.152 kg)  BMI 39.50 kg/m2  SpO2 96%  LMP 08/27/2011  Physical Exam  Nursing note and vitals reviewed. Constitutional: She is oriented to person, place, and time. She appears well-developed and well-nourished. No distress.  HENT:  Head: Normocephalic.  Eyes: Conjunctivae and EOM are normal.  Cardiovascular: Normal  rate.   Pulmonary/Chest: Effort normal.  Abdominal: Soft. Bowel sounds are normal. She exhibits no distension and no mass. There is tenderness. There is no rebound and no guarding.       Mild tenderness to deep palpation of suprapubic area  Genitourinary: No labial fusion. There is no rash, tenderness, lesion or injury on the right labia. There is no rash, tenderness, lesion or injury on the left labia. Uterus is not tender. Cervix exhibits discharge. Cervix exhibits no motion tenderness and no friability. Right adnexum displays no mass, no tenderness and no fullness. Left adnexum displays no mass and no tenderness. There is bleeding around the vagina. No erythema or tenderness around the vagina. No foreign body around the vagina. No signs of injury around the vagina. No vaginal discharge found.       Scant dark blood from the os and pooled in the posterior fourchette.   Musculoskeletal: Normal range of motion.  Neurological: She is alert and oriented to person, place, and time.  Psychiatric: She has a normal mood and affect.    ED Course  Procedures (including critical care time)  Labs Reviewed  WET PREP, GENITAL - Abnormal; Notable for the following:    WBC, Wet Prep HPF POC FEW (*)     All other components within normal limits  URINALYSIS, ROUTINE W REFLEX MICROSCOPIC  PREGNANCY, URINE  GC/CHLAMYDIA PROBE AMP, GENITAL   No results found.   1. Dysfunctional  uterine bleeding       MDM  Patient presenting with moderate vaginal bleeding for 14 days. Patient is taking Implanon for birth control. Urinalysis and urine pregnancy tests are unremarkable. Pelvic exam is clinically unremarkable. No signs of pelvic inflammatory disease or cervicitis.  Wet prep is unremarkable except for scant white blood cells. I will control bleeding with Provera 10 mg daily for 5 days I will encourage the patient to followup with her GYN.  Pt verbalized understanding and agrees with care plan. Outpatient  follow-up and return precautions given.         Wynetta Emery, PA-C 08/27/11 1931

## 2011-08-27 NOTE — ED Notes (Signed)
The pelvic cart is set up at the bedside and ready for the doctors use.

## 2011-08-27 NOTE — ED Notes (Signed)
Pt reports onset of abdominal cramping and vaginal bleeding x 2 weeks.

## 2011-08-28 LAB — GC/CHLAMYDIA PROBE AMP, GENITAL: GC Probe Amp, Genital: NEGATIVE

## 2012-01-22 ENCOUNTER — Emergency Department (HOSPITAL_BASED_OUTPATIENT_CLINIC_OR_DEPARTMENT_OTHER): Payer: BC Managed Care – PPO

## 2012-01-22 ENCOUNTER — Encounter (HOSPITAL_BASED_OUTPATIENT_CLINIC_OR_DEPARTMENT_OTHER): Payer: Self-pay | Admitting: *Deleted

## 2012-01-22 DIAGNOSIS — Y929 Unspecified place or not applicable: Secondary | ICD-10-CM | POA: Insufficient documentation

## 2012-01-22 DIAGNOSIS — Y939 Activity, unspecified: Secondary | ICD-10-CM | POA: Insufficient documentation

## 2012-01-22 DIAGNOSIS — IMO0002 Reserved for concepts with insufficient information to code with codable children: Secondary | ICD-10-CM | POA: Insufficient documentation

## 2012-01-22 DIAGNOSIS — S6990XA Unspecified injury of unspecified wrist, hand and finger(s), initial encounter: Secondary | ICD-10-CM | POA: Insufficient documentation
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
## 2012-01-23 ENCOUNTER — Emergency Department (HOSPITAL_BASED_OUTPATIENT_CLINIC_OR_DEPARTMENT_OTHER)
Admission: EM | Admit: 2012-01-23 | Discharge: 2012-01-23 | Discharge status: Against Medical Advice | Payer: BC Managed Care – PPO | Attending: Emergency Medicine | Admitting: Emergency Medicine

## 2012-01-23 NOTE — ED Notes (Signed)
Pt called to be taken to room but did not answer.

## 2012-05-30 ENCOUNTER — Emergency Department (HOSPITAL_COMMUNITY)
Admission: EM | Admit: 2012-05-30 | Discharge: 2012-05-30 | Disposition: A | Discharge status: Home / Self Care | Payer: Medicaid Other | Attending: Emergency Medicine | Admitting: Emergency Medicine

## 2012-05-30 ENCOUNTER — Other Ambulatory Visit (HOSPITAL_COMMUNITY): Payer: BC Managed Care – PPO

## 2012-05-30 ENCOUNTER — Encounter (HOSPITAL_COMMUNITY): Payer: Self-pay

## 2012-05-30 ENCOUNTER — Emergency Department (HOSPITAL_COMMUNITY): Payer: Medicaid Other

## 2012-05-30 DIAGNOSIS — S0181XA Laceration without foreign body of other part of head, initial encounter: Secondary | ICD-10-CM

## 2012-05-30 DIAGNOSIS — S51809A Unspecified open wound of unspecified forearm, initial encounter: Secondary | ICD-10-CM | POA: Insufficient documentation

## 2012-05-30 DIAGNOSIS — S82892A Other fracture of left lower leg, initial encounter for closed fracture: Secondary | ICD-10-CM

## 2012-05-30 DIAGNOSIS — S0093XA Contusion of unspecified part of head, initial encounter: Secondary | ICD-10-CM

## 2012-05-30 DIAGNOSIS — S51009A Unspecified open wound of unspecified elbow, initial encounter: Secondary | ICD-10-CM | POA: Insufficient documentation

## 2012-05-30 DIAGNOSIS — S51811A Laceration without foreign body of right forearm, initial encounter: Secondary | ICD-10-CM

## 2012-05-30 DIAGNOSIS — S022XXA Fracture of nasal bones, initial encounter for closed fracture: Secondary | ICD-10-CM

## 2012-05-30 DIAGNOSIS — S82899A Other fracture of unspecified lower leg, initial encounter for closed fracture: Secondary | ICD-10-CM

## 2012-05-30 DIAGNOSIS — Y92410 Unspecified street and highway as the place of occurrence of the external cause: Secondary | ICD-10-CM | POA: Insufficient documentation

## 2012-05-30 DIAGNOSIS — S0230XA Fracture of orbital floor, unspecified side, initial encounter for closed fracture: Secondary | ICD-10-CM

## 2012-05-30 DIAGNOSIS — S025XXA Fracture of tooth (traumatic), initial encounter for closed fracture: Secondary | ICD-10-CM

## 2012-05-30 DIAGNOSIS — Y9389 Activity, other specified: Secondary | ICD-10-CM | POA: Insufficient documentation

## 2012-05-30 DIAGNOSIS — F1010 Alcohol abuse, uncomplicated: Secondary | ICD-10-CM | POA: Insufficient documentation

## 2012-05-30 DIAGNOSIS — Z3202 Encounter for pregnancy test, result negative: Secondary | ICD-10-CM | POA: Insufficient documentation

## 2012-05-30 DIAGNOSIS — S1093XA Contusion of unspecified part of neck, initial encounter: Secondary | ICD-10-CM | POA: Insufficient documentation

## 2012-05-30 DIAGNOSIS — T148XXA Other injury of unspecified body region, initial encounter: Secondary | ICD-10-CM | POA: Insufficient documentation

## 2012-05-30 DIAGNOSIS — S023XXA Fracture of orbital floor, initial encounter for closed fracture: Secondary | ICD-10-CM

## 2012-05-30 DIAGNOSIS — F10929 Alcohol use, unspecified with intoxication, unspecified: Secondary | ICD-10-CM

## 2012-05-30 DIAGNOSIS — S0180XA Unspecified open wound of other part of head, initial encounter: Secondary | ICD-10-CM | POA: Insufficient documentation

## 2012-05-30 DIAGNOSIS — R4182 Altered mental status, unspecified: Secondary | ICD-10-CM | POA: Insufficient documentation

## 2012-05-30 DIAGNOSIS — S51012A Laceration without foreign body of left elbow, initial encounter: Secondary | ICD-10-CM

## 2012-05-30 DIAGNOSIS — S0003XA Contusion of scalp, initial encounter: Secondary | ICD-10-CM | POA: Insufficient documentation

## 2012-05-30 HISTORY — DX: Other fracture of unspecified lower leg, initial encounter for closed fracture: S82.899A

## 2012-05-30 HISTORY — DX: Fracture of orbital floor, unspecified side, initial encounter for closed fracture: S02.30XA

## 2012-05-30 HISTORY — DX: Fracture of tooth (traumatic), initial encounter for closed fracture: S02.5XXA

## 2012-05-30 LAB — URINALYSIS, ROUTINE W REFLEX MICROSCOPIC
Glucose, UA: NEGATIVE mg/dL
Ketones, ur: NEGATIVE mg/dL
Leukocytes, UA: NEGATIVE
Specific Gravity, Urine: 1.01 (ref 1.005–1.030)
pH: 5.5 (ref 5.0–8.0)

## 2012-05-30 LAB — RAPID URINE DRUG SCREEN, HOSP PERFORMED
Amphetamines: NOT DETECTED
Benzodiazepines: NOT DETECTED
Cocaine: NOT DETECTED
Opiates: NOT DETECTED

## 2012-05-30 LAB — CBC
HCT: 39.7 % (ref 36.0–46.0)
Hemoglobin: 13.3 g/dL (ref 12.0–15.0)
MCV: 88 fL (ref 78.0–100.0)
RDW: 12.9 % (ref 11.5–15.5)
WBC: 15.9 10*3/uL — ABNORMAL HIGH (ref 4.0–10.5)

## 2012-05-30 LAB — COMPREHENSIVE METABOLIC PANEL
ALT: 18 U/L (ref 0–35)
Albumin: 4.1 g/dL (ref 3.5–5.2)
Alkaline Phosphatase: 71 U/L (ref 39–117)
BUN: 8 mg/dL (ref 6–23)
Potassium: 3.3 mEq/L — ABNORMAL LOW (ref 3.5–5.1)
Sodium: 138 mEq/L (ref 135–145)
Total Protein: 8 g/dL (ref 6.0–8.3)

## 2012-05-30 LAB — POCT I-STAT, CHEM 8
BUN: 7 mg/dL (ref 6–23)
Calcium, Ion: 1.15 mmol/L (ref 1.12–1.23)
Creatinine, Ser: 1 mg/dL (ref 0.50–1.10)
Glucose, Bld: 148 mg/dL — ABNORMAL HIGH (ref 70–99)
TCO2: 23 mmol/L (ref 0–100)

## 2012-05-30 LAB — ETHANOL: Alcohol, Ethyl (B): 143 mg/dL — ABNORMAL HIGH (ref 0–11)

## 2012-05-30 MED ORDER — IOHEXOL 300 MG/ML  SOLN
100.0000 mL | Freq: Once | INTRAMUSCULAR | Status: AC | PRN
  Administered 2012-05-30: 100 mL via INTRAVENOUS

## 2012-05-30 MED ORDER — HYDROCODONE-ACETAMINOPHEN 5-325 MG PO TABS: 1.0000 | ORAL_TABLET | Freq: Four times a day (QID) | ORAL | Status: DC | PRN

## 2012-05-30 MED ORDER — FENTANYL CITRATE 0.05 MG/ML IJ SOLN
50.0000 ug | Freq: Once | INTRAMUSCULAR | Status: AC
  Administered 2012-05-30: 50 ug via INTRAVENOUS
  Filled 2012-05-30: qty 2

## 2012-05-30 MED ORDER — MORPHINE SULFATE 4 MG/ML IJ SOLN
4.0000 mg | Freq: Once | INTRAMUSCULAR | Status: AC
  Administered 2012-05-30: 4 mg via INTRAVENOUS
  Filled 2012-05-30: qty 1

## 2012-05-30 MED ORDER — ONDANSETRON HCL 4 MG/2ML IJ SOLN
4.0000 mg | Freq: Once | INTRAMUSCULAR | Status: AC
  Administered 2012-05-30: 4 mg via INTRAVENOUS
  Filled 2012-05-30: qty 2

## 2012-05-30 NOTE — ED Notes (Signed)
Patient transported to CT/Xray. 

## 2012-05-30 NOTE — Progress Notes (Signed)
Orthopedic Tech Progress Note Patient Details:  Helen Munoz Jun 06, 1985 [id number] Ortho Tech called to apply ankle air cast splint. Nurse stated Tech said he did not know how to put air cast splint on. Ortho Tech arrived to ED to apply air cast splint and educate Nurse Tech on how to apply. Nurse Tech no where to be found in ED. Spoke with nurse and she stated patient is not being discharged soon. Nurse asked to page Ortho Tech back when Nurse Tech comes back so air cast application can be demonstrated and explained.  Patient ID: Helen Munoz, female   DOB: Nov 07, 1985, 27 y.o.   MRN: [id number]   Helen Munoz 05/30/2012, 1:10 PM

## 2012-05-30 NOTE — ED Notes (Signed)
Ortho tech paged & notified of need for splint.

## 2012-05-30 NOTE — ED Provider Notes (Signed)
L posterior upper arm laceration repaired by me.  LACERATION REPAIR Performed by: Fayrene Helper Authorized byFayrene Helper Consent: Verbal consent obtained. Risks and benefits: risks, benefits and alternatives were discussed Consent given by: patient Patient identity confirmed: provided demographic data Prepped and Draped in normal sterile fashion Wound explored  Laceration Location: L posterior distal upper arm laceration  Laceration Length: 9 cm  Multiple small debrides were seen and palpated.    Anesthesia: local infiltration  Local anesthetic: lidocaine 2% w epinephrine  Anesthetic total: 30 ml  Irrigation method: syringe Amount of cleaning: extensive  Sharp debridement with sterile scissors, approximation of skin.  Skin closure: 4.0  Number of sutures: 15  Technique: simple interrupted  Patient tolerance: Patient tolerated the procedure well with no immediate complications.   Fayrene Helper, PA-C 05/30/12 1411
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
## 2012-05-30 NOTE — ED Provider Notes (Signed)
1:23 PM LACERATION REPAIR Performed by: Emilia Beck Authorized by: Emilia Beck Consent: Verbal consent obtained. Risks and benefits: risks, benefits and alternatives were discussed Consent given by: patient Patient identity confirmed: provided demographic data Prepped and Draped in normal sterile fashion Wound explored  Laceration Location: frontal, central hairline  Laceration Length: 1 cm  No Foreign Bodies seen or palpated  Anesthesia: local infiltration  Local anesthetic: lidocaine 2% with epinephrine  Anesthetic total: 1 ml  Irrigation method: syringe Amount of cleaning: standard  Skin closure: 5-0 prolene  Number of sutures: 1  Technique: simple   LACERATION REPAIR Performed by: Emilia Beck Authorized by: Emilia Beck Consent: Verbal consent obtained. Risks and benefits: risks, benefits and alternatives were discussed Consent given by: patient Patient identity confirmed: provided demographic data Prepped and Draped in normal sterile fashion Wound explored  Laceration Location: right volar forearm  Laceration Length: 0.5 cm  No Foreign Bodies seen or palpated  Anesthesia: local infiltration  Local anesthetic: lidocaine 2 % with epinephrine  Anesthetic total: 1 ml  Irrigation method: syringe Amount of cleaning: standard  Skin closure: 5-0 prolene  Number of sutures: 1  Technique: simple  Patient tolerance: Patient tolerated the procedure well with no immediate complications.   Patient tolerance: Patient tolerated the procedure well with no immediate complications.   Emilia Beck, PA-C 05/30/12 1324

## 2012-05-30 NOTE — ED Notes (Signed)
Crutches given, instructed on use by Tresa Endo, RN.  Ambulated using crutches within room B & to bathroom with some assistance
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
## 2012-05-30 NOTE — ED Notes (Signed)
Ankle splint air cast applied
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
## 2012-05-30 NOTE — Progress Notes (Signed)
Orthopedic Tech Progress Note Patient Details:  Helen Munoz Jun 19, 1985 [id number] Went back to check on patient/see if Nurse Tech was around for application education after seeing other patients. Ankle air cast splint had been applied by that point. Attending ED doctor applying sutures to patient. Patient ID: Helen Munoz, female   DOB: 1985-11-25, 27 y.o.   MRN: [id number]   Orie Rout 05/30/2012, 1:35 PM

## 2012-06-01 NOTE — ED Provider Notes (Signed)
Medical screening examination/treatment/procedure(s) were conducted as a shared visit with non-physician practitioner(s) and myself.  I personally evaluated the patient during the encounter See H and P.   Chelbi Herber E Pablo Mathurin, MD 06/01/12 1122 

## 2012-06-01 NOTE — ED Provider Notes (Signed)
Medical screening examination/treatment/procedure(s) were conducted as a shared visit with non-physician practitioner(s) and myself.  I personally evaluated the patient during the encounter See H and P.   Suzi Roots, MD 06/01/12 1122

## 2012-06-07 ENCOUNTER — Encounter (HOSPITAL_COMMUNITY): Payer: Self-pay | Admitting: Emergency Medicine

## 2012-06-07 ENCOUNTER — Emergency Department (HOSPITAL_COMMUNITY)
Admission: EM | Admit: 2012-06-07 | Discharge: 2012-06-07 | Disposition: A | Discharge status: Home / Self Care | Payer: Medicaid Other | Attending: Emergency Medicine | Admitting: Emergency Medicine

## 2012-06-07 DIAGNOSIS — Z4802 Encounter for removal of sutures: Secondary | ICD-10-CM | POA: Insufficient documentation

## 2012-06-07 NOTE — ED Notes (Signed)
Sutures intact forehead, left elbow and right forearm. Appear to be healing adequately.
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
## 2012-06-07 NOTE — ED Notes (Signed)
Here for suture removal in both arms and  head

## 2012-06-08 NOTE — ED Provider Notes (Signed)
Medical screening examination/treatment/procedure(s) were performed by non-physician practitioner and as supervising physician I was immediately available for consultation/collaboration.  Ottie Neglia, MD 06/08/12 0749 

## 2012-06-22 ENCOUNTER — Emergency Department (INDEPENDENT_AMBULATORY_CARE_PROVIDER_SITE_OTHER)
Admission: EM | Admit: 2012-06-22 | Discharge: 2012-06-22 | Disposition: A | Discharge status: Home / Self Care | Payer: Self-pay | Source: Home / Self Care | Attending: Family Medicine | Admitting: Family Medicine

## 2012-06-22 ENCOUNTER — Encounter (HOSPITAL_COMMUNITY): Payer: Self-pay | Admitting: Emergency Medicine

## 2012-06-22 DIAGNOSIS — Z4802 Encounter for removal of sutures: Secondary | ICD-10-CM

## 2012-06-22 NOTE — ED Notes (Signed)
Patient in department for suture removal from left elbow

## 2012-06-22 NOTE — ED Provider Notes (Signed)
History     CSN: [id number]  Arrival date & time 06/22/12  1340   First MD Initiated Contact with Patient 06/22/12 1400      Chief Complaint  Patient presents with  . Suture / Staple Removal    (Consider location/radiation/quality/duration/timing/severity/associated sxs/prior treatment) Patient is a 27 y.o. female presenting with suture removal. The history is provided by the patient.  Suture / Staple Removal This is a new problem. The current episode started more than 1 week ago (5/11 seen in ER for lac, here for suture removal).    History reviewed. No pertinent past medical history.  History reviewed. No pertinent past surgical history.  No family history on file.  History  Substance Use Topics  . Smoking status: Never Smoker   . Smokeless tobacco: Not on file  . Alcohol Use: Yes    OB History   Grav Para Term Preterm Abortions TAB SAB Ect Mult Living                  Review of Systems  Constitutional: Negative.   Skin: Positive for wound.    Allergies  Review of patient's allergies indicates no known allergies.  Home Medications   Current Outpatient Rx  Name  Route  Sig  Dispense  Refill  . amoxicillin (AMOXIL) 250 MG/5ML suspension   Oral   Take 500 mg by mouth 4 (four) times daily - after meals and at bedtime.           BP 107/66  Pulse 84  Temp(Src) 98.1 F (36.7 C) (Oral)  Resp 16  SpO2 99%  Physical Exam  Nursing note and vitals reviewed. Constitutional: She is oriented to person, place, and time. She appears well-developed and well-nourished.  Musculoskeletal:  Left elbow lac well healed, suture removed   Neurological: She is alert and oriented to person, place, and time.  Skin: Skin is warm and dry.    ED Course  Procedures (including critical care time)  Labs Reviewed - No data to display No results found.   No diagnosis found.    MDM  Lac well healed to elbow, sutures removed.        Linna Hoff,  MD 06/22/12 1438

## 2012-07-12 ENCOUNTER — Encounter (HOSPITAL_BASED_OUTPATIENT_CLINIC_OR_DEPARTMENT_OTHER): Payer: Self-pay | Admitting: *Deleted

## 2012-07-15 ENCOUNTER — Encounter (HOSPITAL_BASED_OUTPATIENT_CLINIC_OR_DEPARTMENT_OTHER)
Admission: RE | Disposition: A | Discharge status: Home / Self Care | Payer: Self-pay | Source: Ambulatory Visit | Attending: Plastic Surgery

## 2012-07-15 ENCOUNTER — Ambulatory Visit (HOSPITAL_BASED_OUTPATIENT_CLINIC_OR_DEPARTMENT_OTHER)
Admission: RE | Admit: 2012-07-15 | Discharge: 2012-07-15 | Disposition: A | Discharge status: Home / Self Care | Payer: Medicaid Other | Source: Ambulatory Visit | Attending: Plastic Surgery | Admitting: Plastic Surgery

## 2012-07-15 ENCOUNTER — Ambulatory Visit (HOSPITAL_BASED_OUTPATIENT_CLINIC_OR_DEPARTMENT_OTHER): Payer: Medicaid Other | Admitting: Anesthesiology

## 2012-07-15 ENCOUNTER — Encounter (HOSPITAL_BASED_OUTPATIENT_CLINIC_OR_DEPARTMENT_OTHER): Payer: Self-pay

## 2012-07-15 ENCOUNTER — Encounter (HOSPITAL_BASED_OUTPATIENT_CLINIC_OR_DEPARTMENT_OTHER): Payer: Self-pay | Admitting: Anesthesiology

## 2012-07-15 DIAGNOSIS — S0230XA Fracture of orbital floor, unspecified side, initial encounter for closed fracture: Secondary | ICD-10-CM

## 2012-07-15 DIAGNOSIS — Z6841 Body Mass Index (BMI) 40.0 and over, adult: Secondary | ICD-10-CM | POA: Insufficient documentation

## 2012-07-15 HISTORY — DX: Personal history of other diseases of the respiratory system: Z87.09

## 2012-07-15 HISTORY — PX: ORIF ORBITAL FRACTURE: SHX5312

## 2012-07-15 HISTORY — DX: Fracture of tooth (traumatic), initial encounter for closed fracture: S02.5XXA

## 2012-07-15 HISTORY — DX: Fracture of orbital floor, unspecified side, initial encounter for closed fracture: S02.30XA

## 2012-07-15 HISTORY — DX: Migraine, unspecified, not intractable, without status migrainosus: G43.909

## 2012-07-15 HISTORY — DX: Other fracture of unspecified lower leg, initial encounter for closed fracture: S82.899A

## 2012-07-15 SURGERY — OPEN REDUCTION INTERNAL FIXATION (ORIF) ORBITAL FRACTURE
Anesthesia: General | Site: Eye | Laterality: Right | Wound class: Clean

## 2012-07-15 MED ORDER — MIDAZOLAM HCL 2 MG/2ML IJ SOLN: 1.0000 mg | INTRAMUSCULAR | Status: DC | PRN

## 2012-07-15 MED ORDER — BSS IO SOLN
INTRAOCULAR | Status: DC | PRN
  Administered 2012-07-15: 15 mL via INTRAOCULAR

## 2012-07-15 MED ORDER — LIDOCAINE-EPINEPHRINE 1 %-1:100000 IJ SOLN
INTRAMUSCULAR | Status: DC | PRN
  Administered 2012-07-15: 1 mL

## 2012-07-15 MED ORDER — OXYCODONE HCL 5 MG/5ML PO SOLN: 5.0000 mg | Freq: Once | ORAL | Status: AC | PRN

## 2012-07-15 MED ORDER — FENTANYL CITRATE 0.05 MG/ML IJ SOLN
INTRAMUSCULAR | Status: DC | PRN
  Administered 2012-07-15: 50 ug via INTRAVENOUS
  Administered 2012-07-15: 25 ug via INTRAVENOUS
  Administered 2012-07-15: 100 ug via INTRAVENOUS

## 2012-07-15 MED ORDER — HYDROMORPHONE HCL PF 1 MG/ML IJ SOLN
0.2500 mg | INTRAMUSCULAR | Status: DC | PRN
  Administered 2012-07-15 (×2): 0.5 mg via INTRAVENOUS

## 2012-07-15 MED ORDER — CEFAZOLIN SODIUM-DEXTROSE 2-3 GM-% IV SOLR
INTRAVENOUS | Status: DC | PRN
  Administered 2012-07-15: 2 g via INTRAVENOUS

## 2012-07-15 MED ORDER — LACTATED RINGERS IV SOLN
INTRAVENOUS | Status: DC
  Administered 2012-07-15 (×3): via INTRAVENOUS

## 2012-07-15 MED ORDER — DEXAMETHASONE SODIUM PHOSPHATE 4 MG/ML IJ SOLN
INTRAMUSCULAR | Status: DC | PRN
  Administered 2012-07-15: 10 mg via INTRAVENOUS

## 2012-07-15 MED ORDER — MIDAZOLAM HCL 5 MG/5ML IJ SOLN
INTRAMUSCULAR | Status: DC | PRN
  Administered 2012-07-15: 2 mg via INTRAVENOUS

## 2012-07-15 MED ORDER — ONDANSETRON HCL 4 MG/2ML IJ SOLN
INTRAMUSCULAR | Status: DC | PRN
  Administered 2012-07-15: 4 mg via INTRAVENOUS

## 2012-07-15 MED ORDER — OXYCODONE HCL 5 MG PO TABS
5.0000 mg | ORAL_TABLET | Freq: Once | ORAL | Status: AC | PRN
  Administered 2012-07-15: 5 mg via ORAL

## 2012-07-15 MED ORDER — MIDAZOLAM HCL 2 MG/ML PO SYRP: 12.0000 mg | ORAL_SOLUTION | Freq: Once | ORAL | Status: DC | PRN

## 2012-07-15 MED ORDER — PROPOFOL 10 MG/ML IV BOLUS
INTRAVENOUS | Status: DC | PRN
  Administered 2012-07-15: 150 mg via INTRAVENOUS

## 2012-07-15 MED ORDER — FENTANYL CITRATE 0.05 MG/ML IJ SOLN: 50.0000 ug | INTRAMUSCULAR | Status: DC | PRN

## 2012-07-15 MED ORDER — LIDOCAINE HCL (CARDIAC) 20 MG/ML IV SOLN
INTRAVENOUS | Status: DC | PRN
  Administered 2012-07-15: 75 mg via INTRAVENOUS

## 2012-07-15 MED ORDER — PROMETHAZINE HCL 25 MG/ML IJ SOLN: 6.2500 mg | INTRAMUSCULAR | Status: DC | PRN

## 2012-07-15 MED ORDER — FENTANYL CITRATE 0.05 MG/ML IJ SOLN: 50.0000 ug | Freq: Once | INTRAMUSCULAR | Status: DC

## 2012-07-15 MED ORDER — SUCCINYLCHOLINE CHLORIDE 20 MG/ML IJ SOLN
INTRAMUSCULAR | Status: DC | PRN
  Administered 2012-07-15: 100 mg via INTRAVENOUS

## 2012-07-15 SURGICAL SUPPLY — 41 items
APL SRG 3 HI ABS STRL LF PLS (MISCELLANEOUS) ×1
APPLICATOR DR MATTHEWS STRL (MISCELLANEOUS) ×1 IMPLANT
CANISTER SUCTION 1200CC (MISCELLANEOUS) ×1 IMPLANT
DECANTER SPIKE VIAL GLASS SM (MISCELLANEOUS) ×1 IMPLANT
ELECT NDL BLADE 2-5/6 (NEEDLE) ×1 IMPLANT
ELECT NEEDLE BLADE 2-5/6 (NEEDLE) ×2 IMPLANT
ELECT REM PT RETURN 9FT ADLT (ELECTROSURGICAL) ×2
ELECTRODE REM PT RTRN 9FT ADLT (ELECTROSURGICAL) ×1 IMPLANT
GAUZE PACKING FOLDED 2  STR (GAUZE/BANDAGES/DRESSINGS)
GAUZE PACKING FOLDED 2 STR (GAUZE/BANDAGES/DRESSINGS) IMPLANT
GLOVE BIO SURGEON STRL SZ 6.5 (GLOVE) ×6 IMPLANT
GLOVE SURG SS PI 7.0 STRL IVOR (GLOVE) ×2 IMPLANT
GOWN PREVENTION PLUS XLARGE (GOWN DISPOSABLE) ×5 IMPLANT
HEAT CONTOURING PEN ×1 IMPLANT
NDL HYPO 30GX1 BEV (NEEDLE) ×1 IMPLANT
NEEDLE HYPO 30GX1 BEV (NEEDLE) ×2 IMPLANT
NS IRRIG 1000ML POUR BTL (IV SOLUTION) ×1 IMPLANT
PACK BASIN DAY SURGERY FS (CUSTOM PROCEDURE TRAY) ×2 IMPLANT
PACK ENT DAY SURGERY (CUSTOM PROCEDURE TRAY) ×2 IMPLANT
PATTIES SURGICAL .5 X3 (DISPOSABLE) IMPLANT
PENCIL BUTTON HOLSTER BLD 10FT (ELECTRODE) ×2 IMPLANT
PLATE ORBITAL FLOOR 1.5/0.5M (Plate) ×1 IMPLANT
REPLACEABLE TIP ×1 IMPLANT
SHEILD EYE MED CORNL SHD 22X21 (OPHTHALMIC RELATED) ×2
SHIELD EYE MED CORNL SHD 22X21 (OPHTHALMIC RELATED) ×1 IMPLANT
SLEEVE SCD COMPRESS KNEE MED (MISCELLANEOUS) ×2 IMPLANT
STRIP CLOSURE SKIN 1/2X4 (GAUZE/BANDAGES/DRESSINGS) ×1 IMPLANT
SUT MNCRL 6-0 UNDY P1 1X18 (SUTURE) IMPLANT
SUT MON AB 5-0 P3 18 (SUTURE) IMPLANT
SUT MONOCRYL 6-0 P1 1X18 (SUTURE) ×1
SUT PROLENE 6 0 P 1 18 (SUTURE) IMPLANT
SUT SILK 6 0 P 1 (SUTURE) ×2 IMPLANT
SUT VIC AB 4-0 P-3 18XBRD (SUTURE) IMPLANT
SUT VIC AB 4-0 P3 18 (SUTURE) ×2
SUT VIC AB 4-0 SH 27 (SUTURE)
SUT VIC AB 4-0 SH 27XANBCTRL (SUTURE) IMPLANT
SUT VIC AB 5-0 P-3 18X BRD (SUTURE) IMPLANT
SUT VIC AB 5-0 P3 18 (SUTURE)
SYR BULB 3OZ (MISCELLANEOUS) IMPLANT
TOWEL OR 17X24 6PK STRL BLUE (TOWEL DISPOSABLE) ×3 IMPLANT
TRAY DSU PREP LF (CUSTOM PROCEDURE TRAY) ×2 IMPLANT
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
## 2012-07-15 NOTE — Anesthesia Preprocedure Evaluation (Signed)
Anesthesia Evaluation  Patient identified by MRN, date of birth, ID band Patient awake    Reviewed: Allergy & Precautions, H&P , NPO status , Patient's Chart, lab work & pertinent test results  Airway Mallampati: II TM Distance: <3 FB Neck ROM: Full    Dental   Pulmonary  breath sounds clear to auscultation        Cardiovascular Rhythm:Regular Rate:Normal     Neuro/Psych  Headaches,    GI/Hepatic   Endo/Other  Morbid obesity  Renal/GU      Musculoskeletal   Abdominal (+) + obese,   Peds  Hematology   Anesthesia Other Findings   Reproductive/Obstetrics                           Anesthesia Physical Anesthesia Plan  ASA: II  Anesthesia Plan: General   Post-op Pain Management:    Induction: Intravenous  Airway Management Planned: Oral ETT  Additional Equipment:   Intra-op Plan:   Post-operative Plan: Extubation in OR  Informed Consent: I have reviewed the patients History and Physical, chart, labs and discussed the procedure including the risks, benefits and alternatives for the proposed anesthesia with the patient or authorized representative who has indicated his/her understanding and acceptance.     Plan Discussed with: CRNA and Surgeon  Anesthesia Plan Comments: (Glidescope standby)        Anesthesia Quick Evaluation

## 2012-07-15 NOTE — Anesthesia Postprocedure Evaluation (Signed)
Anesthesia Post Note  Patient: Helen Munoz  Procedure(s) Performed: Procedure(s) (LRB): OPEN REDUCTION INTERNAL FIXATION (ORIF) ORBITAL FRACTURE (Right)  Anesthesia type: General  Patient location: PACU  Post pain: Pain level controlled  Post assessment: Patient's Cardiovascular Status Stable  Last Vitals:  Filed Vitals:   07/15/12 1519  BP:   Pulse: 89  Temp:   Resp: 17    Post vital signs: Reviewed and stable  Level of consciousness: alert  Complications: No apparent anesthesia complications

## 2012-07-15 NOTE — Op Note (Signed)
Operative Note  Pre-operative Diagnosis: right orbital floor fracture  Post-operative Diagnosis: same  Procedure: open reduction internal fixation of right orbital floor fracture  Surgeon: Dr. Alan Ripper Sanger  Assistant: Lazaro Arms, PA  Indications: Orbital floor fracture with entrapement  Anesthesia: 1% lidocaine with epinephrine  Procedure Details  The procedure, risks and complications have been discussed in detail (including, but not limited to airway compromise, infection, bleeding) with the patient, and the patient has signed consent to the procedure. A time out was called and all information was confirmed to be correct.  The skin was sterilely prepped and draped over the face in the usual fashion. After adequate anesthesia, the skin was marked and local was injected for intraoperative hemostasis and post operative pain management.  The #15 blade was used to make an incision below the lashes of the right inferior orbit.  The scissors were than used to dissect down to the orbital rim.  Once the orbital rim was located the periosteum was incised.  The bone elevator was used to lift the periosteum off the bone and expose the fracture. The contents within the fracture line were release and the fracture reduced.  The biomet absorbable floor plate was contoured to fit into the space and placed over the floor. A forced duction test was performed to ensure movement of the extraocular muscles which were equal bilaterally.  The eye shield that had been placed at the start of the case was removed and the eye was irrigated with salt balanced saline.   A 4-0 vicryl was used to tact the orbicularis muscle superior to the orbital rim.  The skin edges were closed with 6-0 Monocryl. Steri strips were placed. She was allowed to wake up and taken to recovery room in stable condition.  EBL: nil cc's  Drains: none  Condition: Tolerated the procedure well  Complications: none.

## 2012-07-15 NOTE — Transfer of Care (Signed)
Immediate Anesthesia Transfer of Care Note  Patient: Helen Munoz  Procedure(s) Performed: Procedure(s): OPEN REDUCTION INTERNAL FIXATION (ORIF) ORBITAL FRACTURE (Right)  Patient Location: PACU  Anesthesia Type:General  Level of Consciousness: awake, alert  and oriented  Airway & Oxygen Therapy: Patient Spontanous Breathing and Patient connected to face mask oxygen  Post-op Assessment: Report given to PACU RN and Post -op Vital signs reviewed and stable  Post vital signs: Reviewed and stable  Complications: No apparent anesthesia complications

## 2012-07-15 NOTE — Brief Op Note (Signed)
07/15/2012  2:11 PM  PATIENT:  Helen Munoz  27 y.o. female  PRE-OPERATIVE DIAGNOSIS:  FRACTURE OF ORBITAL FLOOR--RIGHT  POST-OPERATIVE DIAGNOSIS:  FRACTURE OF ORBITAL FLOOR--RIGHT  PROCEDURE:  Procedure(s): OPEN REDUCTION INTERNAL FIXATION (ORIF) ORBITAL FRACTURE (Right)  SURGEON:  Surgeon(s) and Role:    * Marshawn Ninneman Sanger, DO - Primary  PHYSICIAN ASSISTANT: Shawn Rayburn, PA  ASSISTANTS: none   ANESTHESIA:   general  EBL:  Total I/O In: 1000 [I.V.:1000] Out: -   BLOOD ADMINISTERED:none  DRAINS: none   LOCAL MEDICATIONS USED:  LIDOCAINE   SPECIMEN:  No Specimen  DISPOSITION OF SPECIMEN:  N/A  COUNTS:  YES  TOURNIQUET:  * No tourniquets in log *  DICTATION: .Dragon Dictation  PLAN OF CARE: Discharge to home after PACU  PATIENT DISPOSITION:  PACU - hemodynamically stable.   Delay start of Pharmacological VTE agent (>24hrs) due to surgical blood loss or risk of bleeding: no

## 2012-07-19 ENCOUNTER — Encounter (HOSPITAL_BASED_OUTPATIENT_CLINIC_OR_DEPARTMENT_OTHER): Payer: Self-pay | Admitting: Plastic Surgery

## 2013-01-04 ENCOUNTER — Encounter (HOSPITAL_BASED_OUTPATIENT_CLINIC_OR_DEPARTMENT_OTHER): Payer: Self-pay | Admitting: Emergency Medicine

## 2013-01-04 ENCOUNTER — Emergency Department (HOSPITAL_BASED_OUTPATIENT_CLINIC_OR_DEPARTMENT_OTHER)
Admission: EM | Admit: 2013-01-04 | Discharge: 2013-01-04 | Disposition: A | Discharge status: Home / Self Care | Payer: Medicaid Other | Attending: Emergency Medicine | Admitting: Emergency Medicine

## 2013-01-04 DIAGNOSIS — J329 Chronic sinusitis, unspecified: Secondary | ICD-10-CM

## 2013-01-04 DIAGNOSIS — Z87828 Personal history of other (healed) physical injury and trauma: Secondary | ICD-10-CM | POA: Insufficient documentation

## 2013-01-04 DIAGNOSIS — J45909 Unspecified asthma, uncomplicated: Secondary | ICD-10-CM | POA: Insufficient documentation

## 2013-01-04 DIAGNOSIS — Z8781 Personal history of (healed) traumatic fracture: Secondary | ICD-10-CM | POA: Insufficient documentation

## 2013-01-04 MED ORDER — AZITHROMYCIN 250 MG PO TABS: 250.0000 mg | ORAL_TABLET | Freq: Every day | ORAL | Status: DC
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
## 2013-01-04 NOTE — ED Provider Notes (Signed)
Medical screening examination/treatment/procedure(s) were performed by non-physician practitioner and as supervising physician I was immediately available for consultation/collaboration.  EKG Interpretation   None         Rolan Bucco, MD 01/04/13 2255

## 2013-01-04 NOTE — ED Notes (Signed)
Cough, congestion x 1 week. Denies fever 

## 2013-01-04 NOTE — ED Notes (Signed)
Nasal congestion and cough x 1 week, denies fever. No relief from OTC meds

## 2013-09-29 ENCOUNTER — Encounter (HOSPITAL_COMMUNITY): Payer: Self-pay | Admitting: Emergency Medicine

## 2013-09-29 ENCOUNTER — Emergency Department (HOSPITAL_COMMUNITY)
Admission: EM | Admit: 2013-09-29 | Discharge: 2013-09-29 | Disposition: A | Discharge status: Home / Self Care | Payer: Medicaid Other | Attending: Emergency Medicine | Admitting: Emergency Medicine

## 2013-09-29 DIAGNOSIS — L89309 Pressure ulcer of unspecified buttock, unspecified stage: Secondary | ICD-10-CM | POA: Diagnosis not present

## 2013-09-29 DIAGNOSIS — Z8669 Personal history of other diseases of the nervous system and sense organs: Secondary | ICD-10-CM | POA: Diagnosis not present

## 2013-09-29 DIAGNOSIS — Z79899 Other long term (current) drug therapy: Secondary | ICD-10-CM | POA: Diagnosis not present

## 2013-09-29 DIAGNOSIS — Z8781 Personal history of (healed) traumatic fracture: Secondary | ICD-10-CM | POA: Diagnosis not present

## 2013-09-29 DIAGNOSIS — M79609 Pain in unspecified limb: Secondary | ICD-10-CM | POA: Diagnosis present

## 2013-09-29 DIAGNOSIS — J45909 Unspecified asthma, uncomplicated: Secondary | ICD-10-CM | POA: Insufficient documentation

## 2013-09-29 DIAGNOSIS — Z792 Long term (current) use of antibiotics: Secondary | ICD-10-CM | POA: Insufficient documentation

## 2013-09-29 DIAGNOSIS — L89314 Pressure ulcer of right buttock, stage 4: Secondary | ICD-10-CM

## 2013-09-29 DIAGNOSIS — L02413 Cutaneous abscess of right upper limb: Secondary | ICD-10-CM

## 2013-09-29 DIAGNOSIS — IMO0002 Reserved for concepts with insufficient information to code with codable children: Secondary | ICD-10-CM | POA: Insufficient documentation

## 2013-09-29 DIAGNOSIS — L8994 Pressure ulcer of unspecified site, stage 4: Secondary | ICD-10-CM | POA: Diagnosis not present

## 2013-09-29 LAB — CBG MONITORING, ED: GLUCOSE-CAPILLARY: 88 mg/dL (ref 70–99)

## 2013-09-29 MED ORDER — SODIUM BICARBONATE 4 % IV SOLN
5.0000 mL | Freq: Once | INTRAVENOUS | Status: DC
  Filled 2013-09-29: qty 5

## 2013-09-29 MED ORDER — HYDROCODONE-ACETAMINOPHEN 5-325 MG PO TABS: 1.0000 | ORAL_TABLET | ORAL | Status: DC | PRN

## 2013-09-29 MED ORDER — OXYCODONE-ACETAMINOPHEN 5-325 MG PO TABS
1.0000 | ORAL_TABLET | Freq: Once | ORAL | Status: AC
  Administered 2013-09-29: 1 via ORAL
  Filled 2013-09-29: qty 1

## 2013-09-29 MED ORDER — CEPHALEXIN 500 MG PO CAPS: 500.0000 mg | ORAL_CAPSULE | Freq: Four times a day (QID) | ORAL | Status: DC

## 2013-09-29 MED ORDER — LIDOCAINE HCL (PF) 1 % IJ SOLN
30.0000 mL | Freq: Once | INTRAMUSCULAR | Status: DC
  Filled 2013-09-29: qty 30

## 2013-09-29 MED ORDER — SULFAMETHOXAZOLE-TMP DS 800-160 MG PO TABS: 1.0000 | ORAL_TABLET | Freq: Two times a day (BID) | ORAL | Status: DC
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
## 2013-09-29 NOTE — ED Notes (Signed)
PT reports a insect bite on past Friday to buttocks and now has same on RT wrist.

## 2013-09-29 NOTE — Discharge Instructions (Signed)
Please follow up within 48 hours. For a wound check.   Abscess An abscess is an infected area that contains a collection of pus and debris.It can occur in almost any part of the body. An abscess is also known as a furuncle or boil. CAUSES  An abscess occurs when tissue gets infected. This can occur from blockage of oil or sweat glands, infection of hair follicles, or a minor injury to the skin. As the body tries to fight the infection, pus collects in the area and creates pressure under the skin. This pressure causes pain. People with weakened immune systems have difficulty fighting infections and get certain abscesses more often.  SYMPTOMS Usually an abscess develops on the skin and becomes a painful mass that is red, warm, and tender. If the abscess forms under the skin, you may feel a moveable soft area under the skin. Some abscesses break open (rupture) on their own, but most will continue to get worse without care. The infection can spread deeper into the body and eventually into the bloodstream, causing you to feel ill.  DIAGNOSIS  Your caregiver will take your medical history and perform a physical exam. A sample of fluid may also be taken from the abscess to determine what is causing your infection. TREATMENT  Your caregiver may prescribe antibiotic medicines to fight the infection. However, taking antibiotics alone usually does not cure an abscess. Your caregiver may need to make a small cut (incision) in the abscess to drain the pus. In some cases, gauze is packed into the abscess to reduce pain and to continue draining the area. HOME CARE INSTRUCTIONS   Only take over-the-counter or prescription medicines for pain, discomfort, or fever as directed by your caregiver.  If you were prescribed antibiotics, take them as directed. Finish them even if you start to feel better.  If gauze is used, follow your caregiver's directions for changing the gauze.  To avoid spreading the  infection:  Keep your draining abscess covered with a bandage.  Wash your hands well.  Do not share personal care items, towels, or whirlpools with others.  Avoid skin contact with others.  Keep your skin and clothes clean around the abscess.  Keep all follow-up appointments as directed by your caregiver. SEEK MEDICAL CARE IF:   You have increased pain, swelling, redness, fluid drainage, or bleeding.  You have muscle aches, chills, or a general ill feeling.  You have a fever. MAKE SURE YOU:   Understand these instructions.  Will watch your condition.  Will get help right away if you are not doing well or get worse. Document Released: 10/16/2004 Document Revised: 07/08/2011 Document Reviewed: 03/21/2011 [facility] Patient Information 2015 [place], [place]. This information is not intended to replace advice given to you by your health care provider. Make sure you discuss any questions you have with your health care provider.  Skin Ulcer A skin ulcer is an open sore that can be shallow or deep. Skin ulcers sometimes become infected and are difficult to treat. It may be 1 month or longer before real healing progress is made. CAUSES   Injury.  Problems with the veins or arteries.  Diabetes.  Insect bites.  Bedsores.  Inflammatory conditions. SYMPTOMS   Pain, redness, swelling, and tenderness around the ulcer.  Fever.  Bleeding from the ulcer.  Yellow or clear fluid coming from the ulcer. DIAGNOSIS  There are many types of skin ulcers. Any open sores will be examined. Certain tests will be done to  determine the kind of ulcer you have. The right treatment depends on the type of ulcer you have. TREATMENT  Treatment is a long-term challenge. It may include:  Wearing an elastic wrap, compression stockings, or gel cast over the ulcer area.  Taking antibiotic medicines or putting antibiotic creams on the affected area if there is an infection. HOME CARE  INSTRUCTIONS  Put on your bandages (dressings), wraps, or casts over the ulcer as directed by your caregiver.  Change all dressings as directed by your caregiver.  Take all medicines as directed by your caregiver.  Keep the affected area clean and dry.  Avoid injuries to the affected area.  Eat a well-balanced, healthy diet that includes plenty of fruit and vegetables.  If you smoke, consider quitting or decreasing the amount of cigarettes you smoke.  Once the ulcer heals, get regular exercise as directed by your caregiver.  Work with your caregiver to make sure your blood pressure, cholesterol, and diabetes are well-controlled.  Keep your skin moisturized. Dry skin can crack and lead to skin ulcers. SEEK IMMEDIATE MEDICAL CARE IF:   Your pain gets worse.  You have swelling, redness, or fluids around the ulcer.  You have chills.  You have a fever. MAKE SURE YOU:   Understand these instructions.  Will watch your condition.  Will get help right away if you are not doing well or get worse. Document Released: 02/14/2004 Document Revised: 03/31/2011 Document Reviewed: 08/23/2010 [facility] Patient Information 2015 [place], [place]. This information is not intended to replace advice given to you by your health care provider. Make sure you discuss any questions you have with your health care provider.

## 2013-10-04 NOTE — ED Provider Notes (Signed)
Medical screening examination/treatment/procedure(s) were performed by non-physician practitioner and as supervising physician I was immediately available for consultation/collaboration.   Bryley Chrisman David Joliene Salvador III, MD 10/04/13 1700 

## 2014-06-21 DIAGNOSIS — R7611 Nonspecific reaction to tuberculin skin test without active tuberculosis: Secondary | ICD-10-CM

## 2014-06-21 HISTORY — DX: Nonspecific reaction to tuberculin skin test without active tuberculosis: R76.11

## 2014-07-20 ENCOUNTER — Other Ambulatory Visit: Payer: Self-pay | Admitting: Infectious Disease

## 2014-07-20 ENCOUNTER — Ambulatory Visit
Admission: RE | Admit: 2014-07-20 | Discharge: 2014-07-20 | Disposition: A | Discharge status: Home / Self Care | Payer: No Typology Code available for payment source | Source: Ambulatory Visit | Attending: Infectious Disease | Admitting: Infectious Disease

## 2014-07-20 DIAGNOSIS — R7611 Nonspecific reaction to tuberculin skin test without active tuberculosis: Secondary | ICD-10-CM

## 2014-11-05 ENCOUNTER — Inpatient Hospital Stay (HOSPITAL_COMMUNITY)
Admission: AD | Admit: 2014-11-05 | Discharge: 2014-11-05 | Disposition: A | Discharge status: Home / Self Care | Payer: Medicaid Other | Source: Ambulatory Visit | Attending: Obstetrics & Gynecology | Admitting: Obstetrics & Gynecology

## 2014-11-05 ENCOUNTER — Encounter (HOSPITAL_COMMUNITY): Payer: Self-pay | Admitting: *Deleted

## 2014-11-05 ENCOUNTER — Inpatient Hospital Stay (HOSPITAL_COMMUNITY): Payer: Medicaid Other

## 2014-11-05 DIAGNOSIS — N831 Corpus luteum cyst of ovary, unspecified side: Secondary | ICD-10-CM | POA: Insufficient documentation

## 2014-11-05 DIAGNOSIS — O43891 Other placental disorders, first trimester: Secondary | ICD-10-CM | POA: Diagnosis not present

## 2014-11-05 DIAGNOSIS — O209 Hemorrhage in early pregnancy, unspecified: Secondary | ICD-10-CM

## 2014-11-05 DIAGNOSIS — O208 Other hemorrhage in early pregnancy: Secondary | ICD-10-CM | POA: Diagnosis not present

## 2014-11-05 DIAGNOSIS — O418X10 Other specified disorders of amniotic fluid and membranes, first trimester, not applicable or unspecified: Secondary | ICD-10-CM

## 2014-11-05 DIAGNOSIS — Z3A01 Less than 8 weeks gestation of pregnancy: Secondary | ICD-10-CM | POA: Diagnosis not present

## 2014-11-05 DIAGNOSIS — O468X1 Other antepartum hemorrhage, first trimester: Secondary | ICD-10-CM

## 2014-11-05 DIAGNOSIS — Z3492 Encounter for supervision of normal pregnancy, unspecified, second trimester: Secondary | ICD-10-CM

## 2014-11-05 LAB — URINALYSIS, ROUTINE W REFLEX MICROSCOPIC
Bilirubin Urine: NEGATIVE
GLUCOSE, UA: NEGATIVE mg/dL
Ketones, ur: NEGATIVE mg/dL
Nitrite: NEGATIVE
PH: 6.5 (ref 5.0–8.0)
PROTEIN: NEGATIVE mg/dL
Specific Gravity, Urine: 1.01 (ref 1.005–1.030)
Urobilinogen, UA: 0.2 mg/dL (ref 0.0–1.0)

## 2014-11-05 LAB — ABO/RH: ABO/RH(D): A NEG

## 2014-11-05 LAB — WET PREP, GENITAL
CLUE CELLS WET PREP: NONE SEEN
TRICH WET PREP: NONE SEEN
Yeast Wet Prep HPF POC: NONE SEEN

## 2014-11-05 LAB — URINE MICROSCOPIC-ADD ON

## 2014-11-05 LAB — CBC
HEMATOCRIT: 35.7 % — AB (ref 36.0–46.0)
HEMOGLOBIN: 11.7 g/dL — AB (ref 12.0–15.0)
MCH: 29.7 pg (ref 26.0–34.0)
MCHC: 32.8 g/dL (ref 30.0–36.0)
MCV: 90.6 fL (ref 78.0–100.0)
Platelets: 277 10*3/uL (ref 150–400)
RBC: 3.94 MIL/uL (ref 3.87–5.11)
RDW: 13.6 % (ref 11.5–15.5)
WBC: 10.7 10*3/uL — AB (ref 4.0–10.5)

## 2014-11-05 LAB — HCG, QUANTITATIVE, PREGNANCY: hCG, Beta Chain, Quant, S: 34998 m[IU]/mL — ABNORMAL HIGH (ref <5)

## 2014-11-05 LAB — HIV ANTIBODY (ROUTINE TESTING W REFLEX): HIV Screen 4th Generation wRfx: NONREACTIVE

## 2014-11-05 LAB — POCT PREGNANCY, URINE: Preg Test, Ur: POSITIVE — AB

## 2014-11-05 MED ORDER — RHO D IMMUNE GLOBULIN 1500 UNIT/2ML IJ SOSY
300.0000 ug | PREFILLED_SYRINGE | Freq: Once | INTRAMUSCULAR | Status: AC
  Administered 2014-11-05: 300 ug via INTRAMUSCULAR
  Filled 2014-11-05: qty 2

## 2014-11-05 NOTE — Discharge Instructions (Signed)
Subchorionic Hematoma °A subchorionic hematoma is a gathering of blood between the outer wall of the placenta and the inner wall of the womb (uterus). The placenta is the organ that connects the fetus to the wall of the uterus. The placenta performs the feeding, breathing (oxygen to the fetus), and waste removal (excretory work) of the fetus.  °Subchorionic hematoma is the most common abnormality found on a result from ultrasonography done during the first trimester or early second trimester of pregnancy. If there has been little or no vaginal bleeding, early small hematomas usually shrink on their own and do not affect your baby or pregnancy. The blood is gradually absorbed over 1-2 weeks. When bleeding starts later in pregnancy or the hematoma is larger or occurs in an older pregnant woman, the outcome may not be as good. Larger hematomas may get bigger, which increases the chances for miscarriage. Subchorionic hematoma also increases the risk of premature detachment of the placenta from the uterus, preterm (premature) labor, and stillbirth. °HOME CARE INSTRUCTIONS °· Stay on bed rest if your health care provider recommends this. Although bed rest will not prevent more bleeding or prevent a miscarriage, your health care provider may recommend bed rest until you are advised otherwise. °· Avoid heavy lifting (more than 10 lb [4.5 kg]), exercise, sexual intercourse, or douching as directed by your health care provider. °· Keep track of the number of pads you use each day and how soaked (saturated) they are. Write down this information. °· Do not use tampons. °· Keep all follow-up appointments as directed by your health care provider. Your health care provider may ask you to have follow-up blood tests or ultrasound tests or both. °SEEK IMMEDIATE MEDICAL CARE IF: °· You have severe cramps in your stomach, back, abdomen, or pelvis. °· You have a fever. °· You pass large clots or tissue. Save any tissue for your health  care provider to look at. °· Your bleeding increases or you become lightheaded, feel weak, or have fainting episodes. °  °This information is not intended to replace advice given to you by your health care provider. Make sure you discuss any questions you have with your health care provider. °  °Document Released: 04/23/2006 Document Revised: 01/27/2014 Document Reviewed: 08/05/2012 °Elsevier Interactive Patient Education ©2016 [employer] ° °Pelvic Rest °Pelvic rest is sometimes recommended for women when:  °· The placenta is partially or completely covering the opening of the cervix (placenta previa). °· There is bleeding between the uterine wall and the amniotic sac in the first trimester (subchorionic hemorrhage). °· The cervix begins to open without labor starting (incompetent cervix, cervical insufficiency). °· The labor is too early (preterm labor). °HOME CARE INSTRUCTIONS °· Do not have sexual intercourse, stimulation, or an orgasm. °· Do not use tampons, douche, or put anything in the vagina. °· Do not lift anything over 10 pounds (4.5 kg). °· Avoid strenuous activity or straining your pelvic muscles. °SEEK MEDICAL CARE IF:  °· You have any vaginal bleeding during pregnancy. Treat this as a potential emergency. °· You have cramping pain felt low in the stomach (stronger than menstrual cramps). °· You notice vaginal discharge (watery, mucus, or bloody). °· You have a low, dull backache. °· There are regular contractions or uterine tightening. °SEEK IMMEDIATE MEDICAL CARE IF: °You have vaginal bleeding and have placenta previa.  °  °This information is not intended to replace advice given to you by your health care provider. Make sure you discuss any questions you have   with your health care provider.   Document Released: 05/03/2010 Document Revised: 03/31/2011 Document Reviewed: 07/10/2014 Elsevier Interactive Patient Education [employer]  First Trimester of Pregnancy The first trimester of  pregnancy is from week 1 until the end of week 12 (months 1 through 3). A week after a sperm fertilizes an egg, the egg will implant on the wall of the uterus. This embryo will begin to develop into a baby. Genes from you and your partner are forming the baby. The female genes determine whether the baby is a boy or a girl. At 6-8 weeks, the eyes and face are formed, and the heartbeat can be seen on ultrasound. At the end of 12 weeks, all the baby's organs are formed.  Now that you are pregnant, you will want to do everything you can to have a healthy baby. Two of the most important things are to get good prenatal care and to follow your health care provider's instructions. Prenatal care is all the medical care you receive before the baby's birth. This care will help prevent, find, and treat any problems during the pregnancy and childbirth. BODY CHANGES Your body goes through many changes during pregnancy. The changes vary from woman to woman.   You may gain or lose a couple of pounds at first.  You may feel sick to your stomach (nauseous) and throw up (vomit). If the vomiting is uncontrollable, call your health care provider.  You may tire easily.  You may develop headaches that can be relieved by medicines approved by your health care provider.  You may urinate more often. Painful urination may mean you have a bladder infection.  You may develop heartburn as a result of your pregnancy.  You may develop constipation because certain hormones are causing the muscles that push waste through your intestines to slow down.  You may develop hemorrhoids or swollen, bulging veins (varicose veins).  Your breasts may begin to grow larger and become tender. Your nipples may stick out more, and the tissue that surrounds them (areola) may become darker.  Your gums may bleed and may be sensitive to brushing and flossing.  Dark spots or blotches (chloasma, mask of pregnancy) may develop on your face. This  will likely fade after the baby is born.  Your menstrual periods will stop.  You may have a loss of appetite.  You may develop cravings for certain kinds of food.  You may have changes in your emotions from day to day, such as being excited to be pregnant or being concerned that something may go wrong with the pregnancy and baby.  You may have more vivid and strange dreams.  You may have changes in your hair. These can include thickening of your hair, rapid growth, and changes in texture. Some women also have hair loss during or after pregnancy, or hair that feels dry or thin. Your hair will most likely return to normal after your baby is born. WHAT TO EXPECT AT YOUR PRENATAL VISITS During a routine prenatal visit:  You will be weighed to make sure you and the baby are growing normally.  Your blood pressure will be taken.  Your abdomen will be measured to track your baby's growth.  The fetal heartbeat will be listened to starting around week 10 or 12 of your pregnancy.  Test results from any previous visits will be discussed. Your health care provider may ask you:  How you are feeling.  If you are feeling the baby move.  If you have had any abnormal symptoms, such as leaking fluid, bleeding, severe headaches, or abdominal cramping.  If you are using any tobacco products, including cigarettes, chewing tobacco, and electronic cigarettes.  If you have any questions. Other tests that may be performed during your first trimester include:  Blood tests to find your blood type and to check for the presence of any previous infections. They will also be used to check for low iron levels (anemia) and Rh antibodies. Later in the pregnancy, blood tests for diabetes will be done along with other tests if problems develop.  Urine tests to check for infections, diabetes, or protein in the urine.  An ultrasound to confirm the proper growth and development of the baby.  An amniocentesis to  check for possible genetic problems.  Fetal screens for spina bifida and Down syndrome.  You may need other tests to make sure you and the baby are doing well.  HIV (human immunodeficiency virus) testing. Routine prenatal testing includes screening for HIV, unless you choose not to have this test. HOME CARE INSTRUCTIONS  Medicines  Follow your health care provider's instructions regarding medicine use. Specific medicines may be either safe or unsafe to take during pregnancy.  Take your prenatal vitamins as directed.  If you develop constipation, try taking a stool softener if your health care provider approves. Diet  Eat regular, well-balanced meals. Choose a variety of foods, such as meat or vegetable-based protein, fish, milk and low-fat dairy products, vegetables, fruits, and whole grain breads and cereals. Your health care provider will help you determine the amount of weight gain that is right for you.  Avoid raw meat and uncooked cheese. These carry germs that can cause birth defects in the baby.  Eating four or five small meals rather than three large meals a day may help relieve nausea and vomiting. If you start to feel nauseous, eating a few soda crackers can be helpful. Drinking liquids between meals instead of during meals also seems to help nausea and vomiting.  If you develop constipation, eat more high-fiber foods, such as fresh vegetables or fruit and whole grains. Drink enough fluids to keep your urine clear or pale yellow. Activity and Exercise  Exercise only as directed by your health care provider. Exercising will help you:  Control your weight.  Stay in shape.  Be prepared for labor and delivery.  Experiencing pain or cramping in the lower abdomen or low back is a good sign that you should stop exercising. Check with your health care provider before continuing normal exercises.  Try to avoid standing for long periods of time. Move your legs often if you must  stand in one place for a long time.  Avoid heavy lifting.  Wear low-heeled shoes, and practice good posture.  You may continue to have sex unless your health care provider directs you otherwise. Relief of Pain or Discomfort  Wear a good support bra for breast tenderness.   Take warm sitz baths to soothe any pain or discomfort caused by hemorrhoids. Use hemorrhoid cream if your health care provider approves.   Rest with your legs elevated if you have leg cramps or low back pain.  If you develop varicose veins in your legs, wear support hose. Elevate your feet for 15 minutes, 3-4 times a day. Limit salt in your diet. Prenatal Care  Schedule your prenatal visits by the twelfth week of pregnancy. They are usually scheduled monthly at first, then more often in the last  2 months before delivery.  Write down your questions. Take them to your prenatal visits.  Keep all your prenatal visits as directed by your health care provider. Safety  Wear your seat belt at all times when driving.  Make a list of emergency phone numbers, including numbers for family, friends, the hospital, and police and fire departments. General Tips  Ask your health care provider for a referral to a local prenatal education class. Begin classes no later than at the beginning of month 6 of your pregnancy.  Ask for help if you have counseling or nutritional needs during pregnancy. Your health care provider can offer advice or refer you to specialists for help with various needs.  Do not use hot tubs, steam rooms, or saunas.  Do not douche or use tampons or scented sanitary pads.  Do not cross your legs for long periods of time.  Avoid cat litter boxes and soil used by cats. These carry germs that can cause birth defects in the baby and possibly loss of the fetus by miscarriage or stillbirth.  Avoid all smoking, herbs, alcohol, and medicines not prescribed by your health care provider. Chemicals in these affect  the formation and growth of the baby.  Do not use any tobacco products, including cigarettes, chewing tobacco, and electronic cigarettes. If you need help quitting, ask your health care provider. You may receive counseling support and other resources to help you quit.  Schedule a dentist appointment. At home, brush your teeth with a soft toothbrush and be gentle when you floss. SEEK MEDICAL CARE IF:   You have dizziness.  You have mild pelvic cramps, pelvic pressure, or nagging pain in the abdominal area.  You have persistent nausea, vomiting, or diarrhea.  You have a bad smelling vaginal discharge.  You have pain with urination.  You notice increased swelling in your face, hands, legs, or ankles. SEEK IMMEDIATE MEDICAL CARE IF:   You have a fever.  You are leaking fluid from your vagina.  You have spotting or bleeding from your vagina.  You have severe abdominal cramping or pain.  You have rapid weight gain or loss.  You vomit blood or material that looks like coffee grounds.  You are exposed to [place] and have never had them.  You are exposed to fifth disease or chickenpox.  You develop a severe headache.  You have shortness of breath.  You have any kind of trauma, such as from a fall or a car accident.   This information is not intended to replace advice given to you by your health care provider. Make sure you discuss any questions you have with your health care provider.   Document Released: 12/31/2000 Document Revised: 01/27/2014 Document Reviewed: 11/16/2012 Elsevier Interactive Patient Education [employer]

## 2014-11-05 NOTE — MAU Note (Addendum)
Patient presents stating she is 5-[redacted] weeks gestation via a +HPT and verified by her family doctor with c/o spotting X a couple of days. Denies pain or discharge. 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 [facility]
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
## 2014-11-06 LAB — GC/CHLAMYDIA PROBE AMP (~~LOC~~) NOT AT ARMC
Chlamydia: NEGATIVE
Neisseria Gonorrhea: NEGATIVE

## 2014-11-06 LAB — RH IG WORKUP (INCLUDES ABO/RH)
ABO/RH(D): A NEG
Antibody Screen: NEGATIVE
Gestational Age(Wks): 6.1
UNIT DIVISION: 0

## 2014-11-07 LAB — CULTURE, OB URINE: Special Requests: NORMAL

## 2014-12-18 LAB — OB RESULTS CONSOLE HIV ANTIBODY (ROUTINE TESTING): HIV: NONREACTIVE

## 2014-12-18 LAB — OB RESULTS CONSOLE HEPATITIS B SURFACE ANTIGEN: Hepatitis B Surface Ag: NEGATIVE

## 2014-12-18 LAB — OB RESULTS CONSOLE GC/CHLAMYDIA
Chlamydia: NEGATIVE
Gonorrhea: NEGATIVE

## 2014-12-18 LAB — OB RESULTS CONSOLE RUBELLA ANTIBODY, IGM: RUBELLA: IMMUNE

## 2014-12-18 LAB — OB RESULTS CONSOLE ANTIBODY SCREEN: Antibody Screen: NEGATIVE

## 2014-12-18 LAB — OB RESULTS CONSOLE ABO/RH: RH Type: NEGATIVE

## 2014-12-18 LAB — OB RESULTS CONSOLE RPR: RPR: NONREACTIVE

## 2014-12-19 ENCOUNTER — Other Ambulatory Visit (HOSPITAL_COMMUNITY): Payer: Self-pay | Admitting: Urology

## 2014-12-19 DIAGNOSIS — Z3682 Encounter for antenatal screening for nuchal translucency: Secondary | ICD-10-CM

## 2014-12-19 DIAGNOSIS — Z3A13 13 weeks gestation of pregnancy: Secondary | ICD-10-CM

## 2014-12-21 LAB — OB RESULTS CONSOLE ANTIBODY SCREEN: Antibody Screen: POSITIVE

## 2014-12-27 ENCOUNTER — Ambulatory Visit (HOSPITAL_COMMUNITY)
Admission: RE | Admit: 2014-12-27 | Discharge: 2014-12-27 | Disposition: A | Discharge status: Home / Self Care | Payer: Medicaid Other | Source: Ambulatory Visit | Attending: Physician Assistant | Admitting: Physician Assistant

## 2014-12-27 ENCOUNTER — Other Ambulatory Visit (HOSPITAL_COMMUNITY): Payer: Self-pay | Admitting: Urology

## 2014-12-27 ENCOUNTER — Encounter (HOSPITAL_COMMUNITY): Payer: Self-pay

## 2014-12-27 DIAGNOSIS — Z3682 Encounter for antenatal screening for nuchal translucency: Secondary | ICD-10-CM

## 2014-12-27 DIAGNOSIS — Z36 Encounter for antenatal screening of mother: Secondary | ICD-10-CM | POA: Diagnosis not present

## 2014-12-27 DIAGNOSIS — Z3A13 13 weeks gestation of pregnancy: Secondary | ICD-10-CM

## 2014-12-27 DIAGNOSIS — O99211 Obesity complicating pregnancy, first trimester: Secondary | ICD-10-CM | POA: Diagnosis not present

## 2015-01-09 ENCOUNTER — Other Ambulatory Visit (HOSPITAL_COMMUNITY): Payer: Self-pay

## 2015-01-21 NOTE — L&D Delivery Note (Addendum)
Patient is 30 y.o. [id number] 42103w0d admitted for postdates induction. Patient progressed well through labor and was found to be complete. We initiated pushing and mother was able to push from 0 to +2 after about 10 minutes. After delivery of the head, a shoulder dystocia was noted with an impacted right shoulder. The nurse called for help and we initiated McRoberts, Suprapubic pressure and I attempted rubins and woodscrew. Gaskins maneuver was attempted. Dr. Vergie LivingPickens was called to the room and a Code Apgar was also called. The patient was turned to lithotomy and Dr. Vergie LivingPickens was able to delivery the posterior arm (right arm). Cord was cut and the infant transferred to the awaiting NICU team for assessment.  Cord gasses were collected. Infant was moving both upper extremities and cleared by NICU staff.  Delivery Note At 8:23 PM a viable female was delivered via Vaginal, Spontaneous Delivery (Presentation: Occiput Anterior).  APGAR: 6, 9; weight-4031gm Placenta status: Intact, Spontaneous but had trailing membranes that were removed with ringed forceps. Cord: 3 vessels with the following complications: None.   Cord pH: Arterial- 7.33, 22.0,  Venous-7.207, CO2 24.3  Anesthesia: Epidural  Episiotomy: None Lacerations: None Suture Repair: NA Est. Blood Loss (mL):  350  Mom to postpartum.  Baby to Couplet care / Skin to Skin.  Helen Munoz 07/07/2015, 10:59 PM  Agree with above. When I entered the room, the patient was already in [place], was not pushing and additional OB and pediatric staff was present. The fetus was facing posteriorly, and I placed my hand in the patient and noted the fetus' back to be facing maternal right with the right arm behind the fetus' back. I brought the right arm and hand to the fetus' chest, by sweeping it in that direction, and in that motion the fetus rotated 180 with the fetus' right arm (now anterior and the fetus now facing maternal right) delivering with the  rotation. The fetus' body was then grasped and the rest of the body easily delivered. Immediate cord clamping and cutting was done. Total approximate time from Dr. [place] BlasNewton's recognition of dystocia to infant being delivered was 3 minutes.   Cornelia Copaharlie Khairi Garman, Jr MD Attending Center [facility](Faculty Practice)

## 2015-01-25 ENCOUNTER — Other Ambulatory Visit: Payer: Self-pay | Admitting: Family

## 2015-01-25 DIAGNOSIS — Z3689 Encounter for other specified antenatal screening: Secondary | ICD-10-CM

## 2015-02-02 ENCOUNTER — Ambulatory Visit (HOSPITAL_COMMUNITY)
Admission: RE | Admit: 2015-02-02 | Discharge: 2015-02-02 | Disposition: A | Discharge status: Home / Self Care | Payer: Medicaid Other | Source: Ambulatory Visit | Attending: Obstetrics & Gynecology | Admitting: Obstetrics & Gynecology

## 2015-02-02 DIAGNOSIS — Z36 Encounter for antenatal screening of mother: Secondary | ICD-10-CM | POA: Diagnosis present

## 2015-02-02 DIAGNOSIS — Z3689 Encounter for other specified antenatal screening: Secondary | ICD-10-CM

## 2015-02-22 ENCOUNTER — Encounter: Payer: Medicaid Other | Admitting: Family

## 2015-02-23 ENCOUNTER — Other Ambulatory Visit (HOSPITAL_COMMUNITY): Payer: Self-pay | Admitting: Physician Assistant

## 2015-02-23 DIAGNOSIS — IMO0002 Reserved for concepts with insufficient information to code with codable children: Secondary | ICD-10-CM

## 2015-02-23 DIAGNOSIS — Z0489 Encounter for examination and observation for other specified reasons: Secondary | ICD-10-CM

## 2015-03-02 ENCOUNTER — Other Ambulatory Visit (HOSPITAL_COMMUNITY): Payer: Self-pay | Admitting: Physician Assistant

## 2015-03-02 ENCOUNTER — Ambulatory Visit (HOSPITAL_COMMUNITY)
Admission: RE | Admit: 2015-03-02 | Discharge: 2015-03-02 | Disposition: A | Discharge status: Home / Self Care | Payer: Medicaid Other | Source: Ambulatory Visit | Attending: Family | Admitting: Family

## 2015-03-02 DIAGNOSIS — Z36 Encounter for antenatal screening of mother: Secondary | ICD-10-CM | POA: Insufficient documentation

## 2015-03-02 DIAGNOSIS — Z0489 Encounter for examination and observation for other specified reasons: Secondary | ICD-10-CM

## 2015-03-02 DIAGNOSIS — IMO0002 Reserved for concepts with insufficient information to code with codable children: Secondary | ICD-10-CM

## 2015-03-02 DIAGNOSIS — O99212 Obesity complicating pregnancy, second trimester: Secondary | ICD-10-CM

## 2015-03-02 DIAGNOSIS — Z3A22 22 weeks gestation of pregnancy: Secondary | ICD-10-CM | POA: Insufficient documentation

## 2015-03-23 ENCOUNTER — Encounter (HOSPITAL_COMMUNITY): Payer: Self-pay | Admitting: *Deleted

## 2015-03-23 ENCOUNTER — Inpatient Hospital Stay (HOSPITAL_COMMUNITY)
Admission: AD | Admit: 2015-03-23 | Discharge: 2015-03-23 | Disposition: A | Discharge status: Home / Self Care | Payer: Medicaid Other | Source: Ambulatory Visit | Attending: Obstetrics and Gynecology | Admitting: Obstetrics and Gynecology

## 2015-03-23 DIAGNOSIS — J45909 Unspecified asthma, uncomplicated: Secondary | ICD-10-CM | POA: Insufficient documentation

## 2015-03-23 DIAGNOSIS — Z3A25 25 weeks gestation of pregnancy: Secondary | ICD-10-CM | POA: Diagnosis not present

## 2015-03-23 DIAGNOSIS — O26892 Other specified pregnancy related conditions, second trimester: Secondary | ICD-10-CM | POA: Insufficient documentation

## 2015-03-23 DIAGNOSIS — N898 Other specified noninflammatory disorders of vagina: Secondary | ICD-10-CM | POA: Diagnosis not present

## 2015-03-23 LAB — URINE MICROSCOPIC-ADD ON

## 2015-03-23 LAB — URINALYSIS, ROUTINE W REFLEX MICROSCOPIC
BILIRUBIN URINE: NEGATIVE
Glucose, UA: NEGATIVE mg/dL
KETONES UR: NEGATIVE mg/dL
NITRITE: NEGATIVE
PROTEIN: NEGATIVE mg/dL
SPECIFIC GRAVITY, URINE: 1.02 (ref 1.005–1.030)
pH: 6.5 (ref 5.0–8.0)
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
## 2015-03-23 NOTE — Discharge Instructions (Signed)
We will call you in about 3 days if your urine grows any bacteria. If it does, we will send a prescription to you for an antibiotic. Otherwise, it is unlikely that you have an infection. Reasons to come back are; fever (temp of 100.4 or greater), extreme back pain, or burning when you pee. It is normal for you to have an occasional spot of blood due to pregnancy.   Come to the MAU (maternity admission unit) for 1) Strong contractions every 2-3 minutes for at least 1 hour that do no go away when you drink water or take a warm shower. These contractions will be so strong all you can do is breath through them 2) Vaginal bleeding- anything more than spotting 3) Loss of fluid like you broke your water 4) Decreased movement of your baby

## 2015-03-23 NOTE — MAU Note (Signed)
Pt wiped this evening and noticed small amt of mucous like blood on the tissue and has not seen it again.  Its been 8 yrs since her last preg and she never saw anything like this.  Reports a ;mild amt of low back pain that is stabbing and quick in nature.  States fetus is moving well. Reports diffuculty starting the flow of urin for the past 1-2 week.

## 2015-06-04 LAB — OB RESULTS CONSOLE GBS: GBS: NEGATIVE

## 2015-06-04 LAB — OB RESULTS CONSOLE GC/CHLAMYDIA
Chlamydia: NEGATIVE
GC PROBE AMP, GENITAL: NEGATIVE

## 2015-06-10 ENCOUNTER — Inpatient Hospital Stay (HOSPITAL_COMMUNITY)
Admission: AD | Admit: 2015-06-10 | Discharge: 2015-06-11 | Disposition: A | Discharge status: Home / Self Care | Payer: Medicaid Other | Source: Ambulatory Visit | Attending: Obstetrics and Gynecology | Admitting: Obstetrics and Gynecology

## 2015-06-10 DIAGNOSIS — O368130 Decreased fetal movements, third trimester, not applicable or unspecified: Secondary | ICD-10-CM | POA: Insufficient documentation

## 2015-06-10 DIAGNOSIS — O219 Vomiting of pregnancy, unspecified: Secondary | ICD-10-CM | POA: Insufficient documentation

## 2015-06-10 DIAGNOSIS — Z3689 Encounter for other specified antenatal screening: Secondary | ICD-10-CM

## 2015-06-10 DIAGNOSIS — O99513 Diseases of the respiratory system complicating pregnancy, third trimester: Secondary | ICD-10-CM | POA: Insufficient documentation

## 2015-06-10 DIAGNOSIS — Z3A37 37 weeks gestation of pregnancy: Secondary | ICD-10-CM | POA: Insufficient documentation

## 2015-06-10 HISTORY — DX: Unspecified asthma, uncomplicated: J45.909

## 2015-06-10 NOTE — MAU Note (Signed)
Pt states she has had decreased fetal movement this pm. Began having contractions and some vomiting.

## 2015-06-11 ENCOUNTER — Encounter (HOSPITAL_COMMUNITY): Payer: Self-pay | Admitting: *Deleted

## 2015-06-11 DIAGNOSIS — O368130 Decreased fetal movements, third trimester, not applicable or unspecified: Secondary | ICD-10-CM | POA: Diagnosis not present

## 2015-06-11 DIAGNOSIS — O99513 Diseases of the respiratory system complicating pregnancy, third trimester: Secondary | ICD-10-CM | POA: Diagnosis present

## 2015-06-11 DIAGNOSIS — Z3A37 37 weeks gestation of pregnancy: Secondary | ICD-10-CM

## 2015-06-11 DIAGNOSIS — O219 Vomiting of pregnancy, unspecified: Secondary | ICD-10-CM | POA: Diagnosis not present

## 2015-06-11 LAB — URINALYSIS, ROUTINE W REFLEX MICROSCOPIC
BILIRUBIN URINE: NEGATIVE
Glucose, UA: NEGATIVE mg/dL
HGB URINE DIPSTICK: NEGATIVE
Ketones, ur: NEGATIVE mg/dL
NITRITE: NEGATIVE
PROTEIN: NEGATIVE mg/dL
SPECIFIC GRAVITY, URINE: 1.015 (ref 1.005–1.030)
pH: 6.5 (ref 5.0–8.0)

## 2015-06-11 LAB — URINE MICROSCOPIC-ADD ON: RBC / HPF: NONE SEEN RBC/hpf (ref 0–5)
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
## 2015-07-03 ENCOUNTER — Telehealth (HOSPITAL_COMMUNITY): Payer: Self-pay | Admitting: *Deleted

## 2015-07-03 ENCOUNTER — Encounter (HOSPITAL_COMMUNITY): Payer: Self-pay | Admitting: *Deleted

## 2015-07-03 NOTE — Telephone Encounter (Signed)
Preadmission screen  

## 2015-07-05 ENCOUNTER — Encounter (HOSPITAL_COMMUNITY): Payer: Self-pay | Admitting: Obstetrics & Gynecology

## 2015-07-07 ENCOUNTER — Inpatient Hospital Stay (HOSPITAL_COMMUNITY): Payer: Medicaid Other | Admitting: Anesthesiology

## 2015-07-07 ENCOUNTER — Inpatient Hospital Stay (HOSPITAL_COMMUNITY)
Admission: RE | Admit: 2015-07-07 | Discharge: 2015-07-09 | DRG: 775 | Disposition: A | Discharge status: Home / Self Care | Payer: Medicaid Other | Source: Ambulatory Visit | Attending: Obstetrics and Gynecology | Admitting: Obstetrics and Gynecology

## 2015-07-07 ENCOUNTER — Encounter (HOSPITAL_COMMUNITY): Payer: Self-pay

## 2015-07-07 VITALS — BP 103/57 | HR 80 | Temp 98.2°F | Resp 18 | Ht 64.0 in | Wt 258.0 lb

## 2015-07-07 DIAGNOSIS — O418X10 Other specified disorders of amniotic fluid and membranes, first trimester, not applicable or unspecified: Secondary | ICD-10-CM

## 2015-07-07 DIAGNOSIS — Z8249 Family history of ischemic heart disease and other diseases of the circulatory system: Secondary | ICD-10-CM

## 2015-07-07 DIAGNOSIS — Z8261 Family history of arthritis: Secondary | ICD-10-CM | POA: Diagnosis not present

## 2015-07-07 DIAGNOSIS — Z3A41 41 weeks gestation of pregnancy: Secondary | ICD-10-CM

## 2015-07-07 DIAGNOSIS — O480 Post-term pregnancy: Principal | ICD-10-CM | POA: Diagnosis present

## 2015-07-07 DIAGNOSIS — O468X1 Other antepartum hemorrhage, first trimester: Secondary | ICD-10-CM

## 2015-07-07 DIAGNOSIS — O209 Hemorrhage in early pregnancy, unspecified: Secondary | ICD-10-CM

## 2015-07-07 DIAGNOSIS — O9952 Diseases of the respiratory system complicating childbirth: Secondary | ICD-10-CM | POA: Diagnosis present

## 2015-07-07 DIAGNOSIS — Z6841 Body Mass Index (BMI) 40.0 and over, adult: Secondary | ICD-10-CM

## 2015-07-07 DIAGNOSIS — Z825 Family history of asthma and other chronic lower respiratory diseases: Secondary | ICD-10-CM

## 2015-07-07 DIAGNOSIS — Z3492 Encounter for supervision of normal pregnancy, unspecified, second trimester: Secondary | ICD-10-CM

## 2015-07-07 DIAGNOSIS — J45909 Unspecified asthma, uncomplicated: Secondary | ICD-10-CM | POA: Diagnosis present

## 2015-07-07 LAB — CBC
HEMATOCRIT: 31.2 % — AB (ref 36.0–46.0)
HEMOGLOBIN: 10.4 g/dL — AB (ref 12.0–15.0)
MCH: 28.7 pg (ref 26.0–34.0)
MCHC: 33.3 g/dL (ref 30.0–36.0)
MCV: 86 fL (ref 78.0–100.0)
Platelets: 216 10*3/uL (ref 150–400)
RBC: 3.63 MIL/uL — AB (ref 3.87–5.11)
RDW: 14.1 % (ref 11.5–15.5)
WBC: 12.5 10*3/uL — AB (ref 4.0–10.5)

## 2015-07-07 LAB — TYPE AND SCREEN
ABO/RH(D): A NEG
Antibody Screen: NEGATIVE

## 2015-07-07 LAB — RPR: RPR: NONREACTIVE

## 2015-07-07 MED ORDER — SOD CITRATE-CITRIC ACID 500-334 MG/5ML PO SOLN: 30.0000 mL | ORAL | Status: DC | PRN

## 2015-07-07 MED ORDER — OXYTOCIN 40 UNITS IN LACTATED RINGERS INFUSION - SIMPLE MED
1.0000 m[IU]/min | INTRAVENOUS | Status: DC
  Administered 2015-07-07: 2 m[IU]/min via INTRAVENOUS
  Filled 2015-07-07: qty 1000

## 2015-07-07 MED ORDER — DIPHENHYDRAMINE HCL 50 MG/ML IJ SOLN: 12.5000 mg | INTRAMUSCULAR | Status: DC | PRN

## 2015-07-07 MED ORDER — LACTATED RINGERS IV SOLN: 500.0000 mL | INTRAVENOUS | Status: DC | PRN

## 2015-07-07 MED ORDER — ACETAMINOPHEN 325 MG PO TABS
650.0000 mg | ORAL_TABLET | ORAL | Status: DC | PRN
  Administered 2015-07-07: 650 mg via ORAL
  Filled 2015-07-07: qty 2

## 2015-07-07 MED ORDER — FENTANYL CITRATE (PF) 100 MCG/2ML IJ SOLN: 100.0000 ug | INTRAMUSCULAR | Status: DC | PRN

## 2015-07-07 MED ORDER — EPHEDRINE 5 MG/ML INJ
10.0000 mg | INTRAVENOUS | Status: DC | PRN
  Filled 2015-07-07: qty 2

## 2015-07-07 MED ORDER — LACTATED RINGERS IV SOLN: 500.0000 mL | Freq: Once | INTRAVENOUS | Status: DC

## 2015-07-07 MED ORDER — ONDANSETRON HCL 4 MG/2ML IJ SOLN: 4.0000 mg | Freq: Four times a day (QID) | INTRAMUSCULAR | Status: DC | PRN

## 2015-07-07 MED ORDER — PHENYLEPHRINE 40 MCG/ML (10ML) SYRINGE FOR IV PUSH (FOR BLOOD PRESSURE SUPPORT)
80.0000 ug | PREFILLED_SYRINGE | INTRAVENOUS | Status: DC | PRN
Start: 2015-07-07 — End: 2015-07-09
  Filled 2015-07-07: qty 5

## 2015-07-07 MED ORDER — TERBUTALINE SULFATE 1 MG/ML IJ SOLN
0.2500 mg | Freq: Once | INTRAMUSCULAR | Status: DC | PRN
  Filled 2015-07-07: qty 1

## 2015-07-07 MED ORDER — OXYTOCIN BOLUS FROM INFUSION
500.0000 mL | INTRAVENOUS | Status: DC
  Administered 2015-07-07: 500 mL via INTRAVENOUS

## 2015-07-07 MED ORDER — LACTATED RINGERS IV SOLN
INTRAVENOUS | Status: DC
  Administered 2015-07-07 (×2): via INTRAVENOUS

## 2015-07-07 MED ORDER — OXYTOCIN 40 UNITS IN LACTATED RINGERS INFUSION - SIMPLE MED
2.5000 [IU]/h | INTRAVENOUS | Status: DC
  Filled 2015-07-07: qty 1000

## 2015-07-07 MED ORDER — MISOPROSTOL 200 MCG PO TABS
600.0000 ug | ORAL_TABLET | Freq: Once | ORAL | Status: AC
  Administered 2015-07-07: 600 ug via BUCCAL

## 2015-07-07 MED ORDER — OXYCODONE-ACETAMINOPHEN 5-325 MG PO TABS
2.0000 | ORAL_TABLET | ORAL | Status: DC | PRN
  Filled 2015-07-07 (×2): qty 2

## 2015-07-07 MED ORDER — PHENYLEPHRINE 40 MCG/ML (10ML) SYRINGE FOR IV PUSH (FOR BLOOD PRESSURE SUPPORT)
80.0000 ug | PREFILLED_SYRINGE | INTRAVENOUS | Status: DC | PRN
  Filled 2015-07-07: qty 10
  Filled 2015-07-07: qty 5

## 2015-07-07 MED ORDER — LACTATED RINGERS IV SOLN
500.0000 mL | Freq: Once | INTRAVENOUS | Status: AC
  Administered 2015-07-07: 500 mL via INTRAVENOUS

## 2015-07-07 MED ORDER — LIDOCAINE HCL (PF) 1 % IJ SOLN
INTRAMUSCULAR | Status: DC | PRN
  Administered 2015-07-07 (×2): 6 mL

## 2015-07-07 MED ORDER — LIDOCAINE HCL (PF) 1 % IJ SOLN
30.0000 mL | INTRAMUSCULAR | Status: DC | PRN
  Filled 2015-07-07: qty 30

## 2015-07-07 MED ORDER — FENTANYL 2.5 MCG/ML BUPIVACAINE 1/10 % EPIDURAL INFUSION (WH - ANES)
14.0000 mL/h | INTRAMUSCULAR | Status: DC | PRN
  Administered 2015-07-07 (×2): 14 mL/h via EPIDURAL
  Filled 2015-07-07: qty 125

## 2015-07-07 MED ORDER — MISOPROSTOL 200 MCG PO TABS
ORAL_TABLET | ORAL | Status: AC
  Filled 2015-07-07: qty 3

## 2015-07-07 MED ORDER — OXYCODONE-ACETAMINOPHEN 5-325 MG PO TABS
1.0000 | ORAL_TABLET | ORAL | Status: DC | PRN
  Administered 2015-07-08: 1 via ORAL

## 2015-07-07 MED ORDER — OXYTOCIN 40 UNITS IN LACTATED RINGERS INFUSION - SIMPLE MED: 1.0000 m[IU]/min | INTRAVENOUS | Status: DC

## 2015-07-07 NOTE — Progress Notes (Signed)
Patient ID: Helen BhatBrittany J Yeldell, female   DOB: 05-Aug-1985, 30 y.o.   MRN: [id number] Helen Munoz is a 30 y.o. G3P2002 at 3632w0d.  Subjective: Painless UC's, constant back pain only  Objective: BP 116/74 mmHg  Pulse 83  Temp(Src) 97.7 F (36.5 C) (Oral)  Resp 20  Ht 5\' 4"  (1.626 m)  Wt 258 lb (117.028 kg)  BMI 44.26 kg/m2   FHT:  FHR: 130 bpm, variability: mod,  accelerations:  15x15,  decelerations:  none UC:   Q 2-4 minutes, mild  Dilation: 3.5 Effacement (%): 70 Cervical Position: Posterior Station: Ballotable Presentation: Vertex Exam by:: Henderson NewcomerStephanie Faulk, RN  Labs: Results for orders placed or performed during the hospital encounter of 07/07/15 (from the past 24 hour(s))  CBC     Status: Abnormal   Collection Time: 07/07/15  8:23 AM  Result Value Ref Range   WBC 12.5 (H) 4.0 - 10.5 K/uL   RBC 3.63 (L) 3.87 - 5.11 MIL/uL   Hemoglobin 10.4 (L) 12.0 - 15.0 g/dL   HCT 30.831.2 (L) 65.736.0 - 84.646.0 %   MCV 86.0 78.0 - 100.0 fL   MCH 28.7 26.0 - 34.0 pg   MCHC 33.3 30.0 - 36.0 g/dL   RDW 96.214.1 95.211.5 - 84.115.5 %   Platelets 216 150 - 400 K/uL  Type and screen [facility]     Status: None   Collection Time: 07/07/15  8:23 AM  Result Value Ref Range   ABO/RH(D) A NEG    Antibody Screen NEG    Sample Expiration 07/10/2015   RPR     Status: None   Collection Time: 07/07/15  8:23 AM  Result Value Ref Range   RPR Ser Ql Non Reactive Non Reactive    Assessment / Plan: 3332w0d week IUP Labor: E[place] Fetal Wellbeing:  Category I Pain Control:  None Anticipated MOD:  SVD  [place] Aijalon Kirtz, CNM 07/07/2015 4:36 PM

## 2015-07-07 NOTE — Progress Notes (Signed)
Patient ID: Helen Munoz, female   DOB: 03/27/1985, 30 y.o.   MRN: [id number] Helen BhatBrittany J Munoz is a 30 y.o. G3P2002 at 7374w0d.  Subjective: More uncomfortable. Requesting epidural.   Objective: BP 114/83 mmHg  Pulse 88  Temp(Src) 98.2 F (36.8 C) (Oral)  Resp 18  Ht 5\' 4"  (1.626 m)  Wt 258 lb (117.028 kg)  BMI 44.26 kg/m2  SpO2 96%   FHT:  FHR: 135 bpm, variability: mod,  accelerations:  15x15,  decelerations:  none UC:   Q 2-4 minutes, strong  Dilation: 6 Effacement (%): 70 Cervical Position: Posterior Station: -1 Presentation: Vertex Exam by:: [place] [employer] [place] CNM  Labs: Results for orders placed or performed during the hospital encounter of 07/07/15 (from the past 24 hour(s))  CBC     Status: Abnormal   Collection Time: 07/07/15  8:23 AM  Result Value Ref Range   WBC 12.5 (H) 4.0 - 10.5 K/uL   RBC 3.63 (L) 3.87 - 5.11 MIL/uL   Hemoglobin 10.4 (L) 12.0 - 15.0 g/dL   HCT 62.131.2 (L) 30.836.0 - 65.746.0 %   MCV 86.0 78.0 - 100.0 fL   MCH 28.7 26.0 - 34.0 pg   MCHC 33.3 30.0 - 36.0 g/dL   RDW 84.614.1 96.211.5 - 95.215.5 %   Platelets 216 150 - 400 K/uL  Type and screen [facility]      Status: None   Collection Time: 07/07/15  8:23 AM  Result Value Ref Range   ABO/RH(D) A NEG    Antibody Screen NEG    Sample Expiration 07/10/2015   RPR     Status: None   Collection Time: 07/07/15  8:23 AM  Result Value Ref Range   RPR Ser Ql Non Reactive Non Reactive    Assessment / Plan: 7074w0d week IUP Labor: Active Fetal Wellbeing:  Category I Pain Control:  May have epidural  Anticipated MOD:  SVD  [place] Sanela Evola, CNM 07/07/2015 8:59 PM

## 2015-07-07 NOTE — Anesthesia Preprocedure Evaluation (Signed)
Anesthesia Evaluation  Patient identified by MRN, date of birth, ID band Patient awake    Reviewed: Allergy & Precautions, NPO status , Patient's Chart, lab work & pertinent test results  Airway Mallampati: II  TM Distance: >3 FB Neck ROM: Full    Dental no notable dental hx.    Pulmonary asthma ,    Pulmonary exam normal breath sounds clear to auscultation       Cardiovascular negative cardio ROS Normal cardiovascular exam Rhythm:Regular Rate:Normal     Neuro/Psych  Headaches, negative psych ROS   GI/Hepatic negative GI ROS, Neg liver ROS,   Endo/Other  Morbid obesity  Renal/GU negative Renal ROS  negative genitourinary   Musculoskeletal negative musculoskeletal ROS (+)   Abdominal (+) + obese,   Peds negative pediatric ROS (+)  Hematology negative hematology ROS (+)   Anesthesia Other Findings   Reproductive/Obstetrics negative OB ROS                             Anesthesia Physical Anesthesia Plan  ASA: III  Anesthesia Plan: Epidural   Post-op Pain Management:    Induction: Intravenous  Airway Management Planned: Natural Airway  Additional Equipment:   Intra-op Plan:   Post-operative Plan:   Informed Consent: I have reviewed the patients History and Physical, chart, labs and discussed the procedure including the risks, benefits and alternatives for the proposed anesthesia with the patient or authorized representative who has indicated his/her understanding and acceptance.   Dental advisory given  Plan Discussed with: CRNA  Anesthesia Plan Comments: (Informed consent obtained prior to proceeding including risk of failure, 1% risk of PDPH, risk of minor discomfort and bruising.  Discussed rare but serious complications including epidural abscess, permanent nerve injury, epidural hematoma.  Discussed alternatives to epidural analgesia and patient desires to proceed.  Timeout  performed pre-procedure verifying patient name, procedure, and platelet count.  Patient tolerated procedure well. )        Anesthesia Quick Evaluation

## 2015-07-07 NOTE — Anesthesia Procedure Notes (Signed)
Epidural Patient location during procedure: OB  Staffing Anesthesiologist: [name], [name]  Preanesthetic Checklist Completed: patient identified, site marked, surgical consent, pre-op evaluation, timeout performed, IV checked, risks and benefits discussed and monitors and equipment checked  Epidural Patient position: sitting Prep: DuraPrep Patient monitoring: heart rate and blood pressure Approach: midline Location: L4-L5 Injection technique: LOR saline  Needle:  Needle type: Tuohy  Needle gauge: 17 G Needle length: 9 cm Needle insertion depth: 8 cm Catheter type: closed end flexible Catheter size: 19 Gauge Catheter at skin depth: 15 cm Test dose: negative and Other  Assessment Events: blood not aspirated, injection not painful, no injection resistance, negative IV test and no paresthesia  Additional Notes Reason for block:procedure for pain

## 2015-07-07 NOTE — Consult Note (Signed)
[facility]  Delivery Note:  SVD     07/07/2015  10:36 PM  I was called to the delivery room at the request of the patient's obstetrician (Dr. Vergie LivingPickens) for shoulder dystocia.  PRENATAL HX:  This is a 30 y/o G3P2002 at 5241 and 0/[redacted] weeks gestation who was admitted today for IOL due to postdates.  Her pregnancy has been complicated by failed 1 hour GTT but she passed the 3 hour.  NICU called to delivery room for shoulder dystocia which lasted 3 minutes.    DELIVERY:  Infant was floppy at delivery with poor respiratory effort and HR ~80.  After standard warming, drying and stimulation HR quickly improved to > 100 and she began to cry spontaneously.  APGARs 6 and 9.  Exam notable for molding but was otherwise within normal limits.  Infant moving both arms equally with symmetric moro.  No palpable crepitus.  O2 saturations at 7 minutes were in the 90s.  After 10 minutes, baby left with nurse to assist parents with skin-to-skin care.   _____________________ Electronically Signed By: Maryan CharLindsey Tarena Gockley, MD Neonatologist
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
## 2015-07-07 NOTE — Anesthesia Pain Management Evaluation Note (Signed)
  CRNA Pain Management Visit Note  Patient: Helen Munoz, 30 y.o., female  "Hello I am a [facility]. We have an anesthesia team available at all times to provide care throughout the hospital, including epidural management and anesthesia for C-section. I don't know your plan for the delivery whether it a natural birth, water birth, IV sedation, nitrous supplementation, doula or epidural, but we want to meet your pain goals."   1.Was your pain managed to your expectations on prior hospitalizations? yes   2.What is your expectation for pain management during this hospitalization?     epidural  3.How can we help you reach that goal? epidural  Record the patient's initial score and the patient's pain goal.   Pain: 0  Pain Goal: 3-4  [facility] wants you to be able to say your pain was always managed very well.  [facility] Care[facility]INKLE,Helen Munoz 07/07/2015

## 2015-07-08 ENCOUNTER — Encounter (HOSPITAL_COMMUNITY): Payer: Self-pay

## 2015-07-08 MED ORDER — WITCH HAZEL-GLYCERIN EX PADS: 1.0000 "application " | MEDICATED_PAD | CUTANEOUS | Status: DC | PRN

## 2015-07-08 MED ORDER — PNEUMOCOCCAL VAC POLYVALENT 25 MCG/0.5ML IJ INJ
0.5000 mL | INJECTION | INTRAMUSCULAR | Status: DC
  Filled 2015-07-08: qty 0.5

## 2015-07-08 MED ORDER — COCONUT OIL OIL: 1.0000 "application " | TOPICAL_OIL | Status: DC | PRN

## 2015-07-08 MED ORDER — ONDANSETRON HCL 4 MG/2ML IJ SOLN: 4.0000 mg | INTRAMUSCULAR | Status: DC | PRN

## 2015-07-08 MED ORDER — SIMETHICONE 80 MG PO CHEW: 80.0000 mg | CHEWABLE_TABLET | ORAL | Status: DC | PRN

## 2015-07-08 MED ORDER — DOCUSATE SODIUM 100 MG PO CAPS
100.0000 mg | ORAL_CAPSULE | Freq: Two times a day (BID) | ORAL | Status: DC
  Administered 2015-07-08 – 2015-07-09 (×2): 100 mg via ORAL
  Filled 2015-07-08 (×2): qty 1

## 2015-07-08 MED ORDER — PRENATAL MULTIVITAMIN CH
1.0000 | ORAL_TABLET | Freq: Every day | ORAL | Status: DC
  Administered 2015-07-08: 1 via ORAL
  Filled 2015-07-08: qty 1

## 2015-07-08 MED ORDER — ONDANSETRON HCL 4 MG PO TABS: 4.0000 mg | ORAL_TABLET | ORAL | Status: DC | PRN

## 2015-07-08 MED ORDER — IBUPROFEN 600 MG PO TABS
600.0000 mg | ORAL_TABLET | Freq: Four times a day (QID) | ORAL | Status: DC
  Administered 2015-07-08 – 2015-07-09 (×5): 600 mg via ORAL
  Filled 2015-07-08 (×5): qty 1

## 2015-07-08 MED ORDER — DIBUCAINE 1 % RE OINT
1.0000 | TOPICAL_OINTMENT | RECTAL | Status: DC | PRN
Start: 2015-07-08 — End: 2015-07-09

## 2015-07-08 MED ORDER — OXYCODONE-ACETAMINOPHEN 5-325 MG PO TABS
1.0000 | ORAL_TABLET | ORAL | Status: DC | PRN
Start: 2015-07-08 — End: 2015-07-09
  Filled 2015-07-08: qty 1

## 2015-07-08 MED ORDER — DIPHENHYDRAMINE HCL 25 MG PO CAPS: 25.0000 mg | ORAL_CAPSULE | Freq: Four times a day (QID) | ORAL | Status: DC | PRN

## 2015-07-08 MED ORDER — BENZOCAINE-MENTHOL 20-0.5 % EX AERO: 1.0000 "application " | INHALATION_SPRAY | CUTANEOUS | Status: DC | PRN

## 2015-07-08 MED ORDER — RHO D IMMUNE GLOBULIN 1500 UNIT/2ML IJ SOSY
300.0000 ug | PREFILLED_SYRINGE | Freq: Once | INTRAMUSCULAR | Status: AC
  Administered 2015-07-08: 300 ug via INTRAMUSCULAR
  Filled 2015-07-08: qty 2

## 2015-07-08 MED ORDER — OXYCODONE-ACETAMINOPHEN 5-325 MG PO TABS
2.0000 | ORAL_TABLET | ORAL | Status: DC | PRN
  Administered 2015-07-08 (×2): 2 via ORAL

## 2015-07-08 MED ORDER — ACETAMINOPHEN 325 MG PO TABS: 650.0000 mg | ORAL_TABLET | ORAL | Status: DC | PRN

## 2015-07-08 NOTE — Lactation Note (Signed)
This note was copied from a baby's chart. Lactation Consultation Note  Initial visit made.  Breastfeeding consultation services and support information given to patient.  This is mom's third baby and she states previous babies would not latch.  Newborn is 5815 hours old and has not latched.  Mom states she tried last night but not successful so giving formula bottles.  I asked mom if we could assist her with latching baby.  She states baby just fed so she will call for next feed.  Patient Name: Helen Munoz Today's Date: 07/08/2015 Reason for consult: Initial assessment   Maternal Data    Feeding    LATCH Score/Interventions                      Lactation Tools Discussed/Used     Consult Status Consult Status: Follow-up Date: 07/09/15 Follow-up type: In-patient    Huston FoleyMOULDEN, Naasir Carreira S 07/08/2015, 1:35 PM

## 2015-07-08 NOTE — Anesthesia Postprocedure Evaluation (Signed)
Anesthesia Post Note  Patient: Helen Munoz  Procedure(s) Performed: * No procedures listed *  Patient location during evaluation: Mother Baby Anesthesia Type: Epidural Level of consciousness: awake Pain management: satisfactory to patient Vital Signs Assessment: post-procedure vital signs reviewed and stable Respiratory status: spontaneous breathing Cardiovascular status: stable Anesthetic complications: no     Last Vitals:  Filed Vitals:   07/08/15 0138 07/08/15 0606  BP: 119/61 94/46  Pulse: 103 95  Temp: 37.1 C 36.9 C  Resp: 20 18    Last Pain:  Filed Vitals:   07/08/15 0936  PainSc: 1    Pain Goal: Patients Stated Pain Goal: 3 (07/08/15 [id number])               Cephus ShellingBURGER,Andrue Dini

## 2015-07-08 NOTE — Progress Notes (Signed)
Post Partum Day 1 Subjective: no complaints, up ad lib, voiding and tolerating PO  Objective: Blood pressure 94/46, pulse 95, temperature 98.5 F (36.9 C), temperature source Oral, resp. rate 18, height 5\' 4"  (1.626 m), weight 258 lb (117.028 kg), SpO2 98 %, unknown if currently breastfeeding.  Physical Exam:  General: alert, cooperative, appears stated age and no distress Lochia: appropriate Uterine Fundus: firm Incision: n/a DVT Evaluation: Negative Homan's sign. No cords or calf tenderness. No significant calf/ankle edema.   Recent Labs  07/07/15 0823  HGB 10.4*  HCT 31.2*    Assessment/Plan: Plan for discharge tomorrow   LOS: 1 day   Helen Munoz 07/08/2015, 10:30 AM

## 2015-07-09 LAB — RH IG WORKUP (INCLUDES ABO/RH)
ABO/RH(D): A NEG
Fetal Screen: NEGATIVE
GESTATIONAL AGE(WKS): 41
Unit division: 0

## 2015-07-09 NOTE — Lactation Note (Signed)
This note was copied from a baby's chart. Lactation Consultation Note  Patient Name: Girl Mollie GermanyBrittany Bernhart Today's Date: 07/09/2015 Reason for consult: Follow-up assessment   Follow up with mom of 34 hour old infant. Infant was quietly alert in mom's arms sucking pacifier. Mom reports she is no longer planning to breastfeed, She reports infant will not latch and she has decided to switch. She declined assistance and says she does not want to pump. She denies any questions at this time. Enc her to call prn questions/concerns.   Maternal Data Formula Feeding for Exclusion: Yes Does the patient have breastfeeding experience prior to this delivery?: No  Feeding    LATCH Score/Interventions                      Lactation Tools Discussed/Used     Consult Status Consult Status: Complete Follow-up type: Call as needed    Ed BlalockSharon S Vash Quezada 07/09/2015, 9:12 AM

## 2015-07-09 NOTE — Discharge Instructions (Signed)

## 2015-07-09 NOTE — Discharge Summary (Signed)
OB Discharge Summary     Patient Name: Helen Munoz DOB: 1986-01-06 MRN: [id number]  Date of admission: 07/07/2015 Delivering MD: Oglesby Bing   Date of discharge: 07/09/2015  Admitting diagnosis: INDUCTION Intrauterine pregnancy: [redacted]w[redacted]d     Secondary diagnosis:  Principal Problem:   Post term pregnancy  Additional problems: Asthma, positive PPD test treated with rifampin 2 months prior to pregnancy, shoulder dystocia     Discharge diagnosis: Term Pregnancy Delivered                                                                                                Post partum procedures:rhogam  Augmentation: AROM and Pitocin  Complications: 3 minute shoulder dystocia  Hospital course:  Induction of Labor With Vaginal Delivery   30 y.o. yo G3P3003 at [redacted]w[redacted]d was admitted to the hospital 07/07/2015 for induction of labor.  Indication for induction: Postdates.  Patient had an uncomplicated labor course as follows: Membrane Rupture Time/Date: 9:44 PM ,07/07/2015   Intrapartum Procedures: Episiotomy: None [1]                                         Lacerations:  None [1]  Patient had delivery of a Viable infant.  Information for the patient's newborn:  Chamille, Werntz [[id number]]  Delivery Method: Vaginal, Spontaneous Delivery (Filed from Delivery Summary)   07/07/2015  Details of delivery can be found in separate delivery note.  Patient had a routine postpartum course. Patient is discharged home 07/09/2015.   Physical exam  Filed Vitals:   07/08/15 0606 07/08/15 1801 07/08/15 2140 07/09/15 0607  BP: 94/46 96/53 106/61 103/57  Pulse: 95 77 85 80  Temp: 98.5 F (36.9 C) 97.7 F (36.5 C) 97.6 F (36.4 C) 98.2 F (36.8 C)  TempSrc: Oral Oral Oral   Resp: Height:      Weight:      SpO2: 98% 98% 99% 97%   General: alert, cooperative and no distress Lochia: appropriate Uterine Fundus: firm Incision: N/A DVT Evaluation: No evidence of DVT seen on  physical exam. Labs: Lab Results  Component Value Date   WBC 12.5* 07/07/2015   HGB 10.4* 07/07/2015   HCT 31.2* 07/07/2015   MCV 86.0 07/07/2015   PLT 216 07/07/2015   CMP Latest Ref Rng 05/30/2012  Glucose 70 - 99 mg/dL 962(X)  BUN 6 - 23 mg/dL 7  Creatinine 5.28 - 4.13 mg/dL 2.44  Sodium 010 - 272 mEq/L 142  Potassium 3.5 - 5.1 mEq/L 3.3(L)  Chloride 96 - 112 mEq/L 108  CO2 19 - 32 mEq/L -  Calcium 8.4 - 10.5 mg/dL -  Total Protein 6.0 - 8.3 g/dL -  Total Bilirubin 0.3 - 1.2 mg/dL -  Alkaline Phos 39 - 536 U/L -  AST 0 - 37 U/L -  ALT 0 - 35 U/L -    Discharge instruction: per After Visit Summary and "Baby and Me Booklet".  After visit meds:  Medication List    ASK your doctor about these medications        acetaminophen 500 MG tablet  Commonly known as:  TYLENOL  Take 1,000 mg by mouth every 6 (six) hours as needed for moderate pain.     multivitamin-prenatal 27-0.8 MG Tabs tablet  Take 1 tablet by mouth daily at 12 noon.        Diet: carb modified diet  Activity: Advance as tolerated. Pelvic rest for 6 weeks.   Outpatient follow up:6 weeks Follow up Appt:No future appointments. Follow up Visit:No Follow-up on file.  Postpartum contraception: Undecided  Newborn Data: Live born female  Birth Weight: 8 lb 14.2 oz (4031 g) APGAR: 6, 9  Baby Feeding: Bottle and Breast Disposition:home with mother   07/09/2015 Olena LeatherwoodKelly M Aguilar, MD

## 2015-07-14 NOTE — Discharge Summary (Signed)
OB Discharge Summary   Patient Name: Helen Munoz DOB: [birth year] MRN: [id number]  Date of admission: 07/07/2015 Delivering MD:  BingPICKENS, CHARLIE   Date of discharge: 07/09/2015  Admitting diagnosis: INDUCTION Intrauterine pregnancy: 592w0d  Secondary diagnosis: Principal Problem:  Post term pregnancy  Additional problems: Asthma, positive PPD test treated with rifampin 2 months prior to pregnancy, shoulder dystocia  Discharge diagnosis: Term Pregnancy Delivered    Post partum procedures:rhogam  Augmentation: AROM and Pitocin  Complications: 3 minute shoulder dystocia  Hospital course: Induction of Labor With Vaginal Delivery  30 y.o. yo G3P3003 at 842w0d was admitted to the hospital 07/07/2015 for induction of labor. Indication for induction: Postdates. Patient had an uncomplicated labor course as follows: Membrane Rupture Time/Date: 9:44 PM ,07/07/2015   Intrapartum Procedures: Episiotomy: None [1]   Lacerations: None [1]  Patient had delivery of a Viable infant.  Information for the patient's newborn:  Paulene FloorSands, Girl Elita [[id number]][[id number]]  Delivery Method: Vaginal, Spontaneous Delivery (Filed from Delivery Summary)   07/07/2015  Details of delivery can be found in separate delivery note. Patient had a routine postpartum course. Patient is discharged home 07/09/2015.   Physical exam  Filed Vitals:   07/08/15 0606 07/08/15 1801 07/08/15 2140 07/09/15 0607  BP: 94/46 96/53 106/61 103/57  Pulse: 95 77 85 80  Temp: 98.5 F (36.9 C) 97.7 F (36.5 C) 97.6 F (36.4 C) 98.2 F (36.8 C)  TempSrc: Oral Oral Oral   Resp: 18 16 16 18   Height:      Weight:      SpO2: 98% 98% 99% 97%   General: alert,  cooperative and no distress Lochia: appropriate Uterine Fundus: firm Incision: N/A DVT Evaluation: No evidence of DVT seen on physical exam. Labs:  Recent Labs    Lab Results  Component Value Date   WBC 12.5* 07/07/2015   HGB 10.4* 07/07/2015   HCT 31.2* 07/07/2015   MCV 86.0 07/07/2015   PLT 216 07/07/2015     CMP Latest Ref Rng 05/30/2012  Glucose 70 - 99 mg/dL 782(N148(H)  BUN 6 - 23 mg/dL 7  Creatinine 5.620.50 - 1.301.10 mg/dL 8.651.00  Sodium 784135 - 696145 mEq/L 142  Potassium 3.5 - 5.1 mEq/L 3.3(L)  Chloride 96 - 112 mEq/L 108  CO2 19 - 32 mEq/L -  Calcium 8.4 - 10.5 mg/dL -  Total Protein 6.0 - 8.3 g/dL -  Total Bilirubin 0.3 - 1.2 mg/dL -  Alkaline Phos 39 - 295117 U/L -  AST 0 - 37 U/L -  ALT 0 - 35 U/L -    Discharge instruction: per After Visit Summary and "Baby and Me Booklet".  After visit meds:    Medication List    ASK your doctor about these medications       acetaminophen 500 MG tablet  Commonly known as: TYLENOL  Take 1,000 mg by mouth every 6 (six) hours as needed for moderate pain.     multivitamin-prenatal 27-0.8 MG Tabs tablet  Take 1 tablet by mouth daily at 12 noon.        Diet: carb modified diet  Activity: Advance as tolerated. Pelvic rest for 6 weeks.   Outpatient follow up:6 weeks Follow up Appt:No future appointments. Follow up Visit:No Follow-up on file.  Postpartum contraception: Undecided  Newborn Data: Live born female  Birth Weight: 8 lb 14.2 oz (4031 g) APGAR: 6, 9  Baby Feeding: Bottle and Breast Disposition:home with mother   07/09/2015 Olena LeatherwoodKelly M Aguilar, MD

## 2015-08-31 ENCOUNTER — Encounter: Payer: Self-pay | Admitting: *Deleted

## 2015-09-06 ENCOUNTER — Encounter: Payer: Self-pay | Admitting: Obstetrics & Gynecology

## 2015-09-06 ENCOUNTER — Ambulatory Visit (INDEPENDENT_AMBULATORY_CARE_PROVIDER_SITE_OTHER): Payer: Medicaid Other | Admitting: Obstetrics & Gynecology

## 2015-09-06 VITALS — BP 104/71 | HR 73 | Resp 20 | Ht 64.0 in | Wt 248.0 lb

## 2015-09-06 DIAGNOSIS — Z3009 Encounter for other general counseling and advice on contraception: Secondary | ICD-10-CM | POA: Diagnosis not present

## 2015-09-06 DIAGNOSIS — Z30011 Encounter for initial prescription of contraceptive pills: Secondary | ICD-10-CM | POA: Diagnosis not present

## 2015-09-06 MED ORDER — NORGESTIMATE-ETH ESTRADIOL 0.25-35 MG-MCG PO TABS: 1.0000 | ORAL_TABLET | Freq: Every day | ORAL | 2 refills | Status: DC

## 2015-09-06 NOTE — Patient Instructions (Addendum)
Thank you for enrolling in MyChart. Please follow the instructions below to securely access your online medical record. MyChart allows you to send messages to your doctor, view your test results, manage appointments, and more.   How Do I Sign Up? 1. In your Internet browser, go to [employer] Address [employer] and enter [email or web address]. 2. Click on the Sign Up Now link in the Sign In box. You will see the New Member Sign Up page. 3. Enter your MyChart Access Code exactly as it appears below. You will not need to use this code after you've completed the sign-up process. If you do not sign up before the expiration date, you must request a new code.  MyChart Access Code: [email or web address] Expires: 11/05/2015  2:40 PM  4. Enter your Social Security Number ([id number]) and Date of Birth (mm/dd/yyyy) as indicated and click Submit. You will be taken to the next sign-up page. 5. Create a MyChart ID. This will be your MyChart login ID and cannot be changed, so think of one that is secure and easy to remember. 6. Create a MyChart password. You can change your password at any time. 7. Enter your Password Reset Question and Answer. This can be used at a later time if you forget your password.  8. Enter your e-mail address. You will receive e-mail notification when new information is available in MyChart. 9. Click Sign Up. You can now view your medical record.   Additional Information Remember, MyChart is NOT to be used for urgent needs. For medical emergencies, dial 911.      Laparoscopic Tubal Ligation Laparoscopic tubal ligation is a procedure that closes the fallopian tubes at a time other than right after childbirth. When the fallopian tubes are closed, the eggs that are released from the ovaries cannot enter the uterus, and sperm cannot reach the egg. Tubal ligation is also known as getting your "tubes tied." Tubal ligation is done so you will not be able to get pregnant or have a baby. Although  this procedure may be undone (reversed), it should be considered permanent and irreversible. If you want to have future pregnancies, you should not have this procedure. LET [facility]OUR [facility]ARE PROVIDER KNOW ABOUT:  Any allergies you have.  All medicines you are taking, including vitamins, herbs, eye drops, creams, and over-the-counter medicines. This includes any use of steroids, either by mouth or in cream form.  Previous problems you or members of your family have had with the use of anesthetics.  Any blood disorders you have.  Previous surgeries you have had.  Any medical conditions you may have.  Possibility of pregnancy, if this applies.  Any past pregnancies. RISKS AND COMPLICATIONS  Infection.  Bleeding.  Injury to surrounding organs.  Side effects from anesthetics.  Failure of the procedure.  Ectopic pregnancy.  Future regret about having the procedure done. BEFORE THE PROCEDURE  Ask your health care provider about:  Changing or stopping your regular medicines. This is especially important if you are taking diabetes medicines or blood thinners.  Taking medicines such as aspirin and ibuprofen. These medicines can thin your blood. Do not take these medicines before your procedure if your health care provider instructs you not to.  Follow instructions from your health care provider about eating and drinking restrictions.  Plan to have someone take you home after the procedure.  If you go home right after the procedure, plan to have someone with you for 24 hours. PROCEDURE  You will be given one or  more of the following:  A medicine that helps you relax (sedative).  A medicine that numbs the area (local anesthetic).  A medicine that makes you fall asleep (general anesthetic).  A medicine that is injected into an area of your body that numbs everything below the injection site (regional anesthetic).  If you have been given general anesthetic, a tube will be  put down your throat to help you breathe.  Two small cuts (incisions) will be made in the lower abdominal area and near the belly button.  Your bladder may be emptied with a small tube (catheter).  Your abdomen will be inflated with a safe gas (carbon dioxide). This will help to give the surgeon room to operate and visualize, and it will help the surgeon to avoid other organs.  A thin, lighted tube (laparoscope) with a camera attached will be inserted into your abdomen through one of the incisions near the belly button. Other small instruments will be inserted through the other abdominal incision.  The fallopian tubes will be tied off or burned (cauterized), or they will be blocked with a clip, ring, or clamp. In many cases, a small portion in the center of each fallopian tube will also be removed.  After the fallopian tubes are blocked, the gas will be released from the abdomen.  The incisions will be closed with stitches (sutures).  A bandage (dressing) will be placed over the incisions. The procedure may vary among health care providers and hospitals. AFTER THE PROCEDURE  Your blood pressure, heart rate, breathing rate, and blood oxygen level will be monitored often until the medicines you were given have worn off.  You will be given pain medicine as needed.  If you had general anesthetic, you may have some mild discomfort in your throat. This is from the breathing tube that was placed in your throat while you were sleeping.  You may experience discomfort in the shoulder area from some trapped air between your liver and your diaphragm. This sensation is normal, and it will slowly go away on its own.  You will have some mild abdominal discomfort for 3--7 days.   This information is not intended to replace advice given to you by your health care provider. Make sure you discuss any questions you have with your health care provider.   Document Released: 04/14/2000 Document Revised:  05/23/2014 Document Reviewed: 04/19/2011 Elsevier Interactive Patient Education [employer] Elsevier Inc.
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
## 2015-09-12 ENCOUNTER — Encounter (HOSPITAL_COMMUNITY): Payer: Self-pay | Admitting: *Deleted

## 2015-09-20 NOTE — Patient Instructions (Addendum)
Your procedure is scheduled on: 09/26/15  Enter through the Main Entrance at : 9:30 am Pick up desk phone and dial [phone] and inform us of your arrival.  Please call [phone] if you have any problems the morning of surgery.  Remember: Do not eat food or drink liquids, including water, after midnight:Tuesday   You may brush your teeth the morning of surgery.  Take these meds the morning of surgery with a sip of water:  DO NOT wear jewelry, eye make-up, lipstick,body lotion, or dark fingernail polish.  (Polished toes are ok) You may wear deodorant.  Patients discharged on the day of surgery will not be allowed to drive home.  Wear loose fitting, comfortable clothes for your ride home.

## 2015-09-21 ENCOUNTER — Inpatient Hospital Stay (HOSPITAL_COMMUNITY)
Admission: RE | Admit: 2015-09-21 | Discharge: 2015-09-21 | Disposition: A | Discharge status: Home / Self Care | Payer: Medicaid Other | Source: Ambulatory Visit

## 2015-10-04 ENCOUNTER — Encounter (HOSPITAL_COMMUNITY): Payer: Self-pay | Admitting: *Deleted

## 2015-10-25 ENCOUNTER — Ambulatory Visit (HOSPITAL_COMMUNITY)
Admission: RE | Admit: 2015-10-25 | Discharge: 2015-10-25 | Disposition: A | Discharge status: Home / Self Care | Payer: Medicaid Other | Source: Ambulatory Visit | Attending: Obstetrics & Gynecology | Admitting: Obstetrics & Gynecology

## 2015-10-25 ENCOUNTER — Ambulatory Visit (HOSPITAL_COMMUNITY): Payer: Medicaid Other | Admitting: Anesthesiology

## 2015-10-25 ENCOUNTER — Encounter (HOSPITAL_COMMUNITY)
Admission: RE | Disposition: A | Discharge status: Home / Self Care | Payer: Self-pay | Source: Ambulatory Visit | Attending: Obstetrics & Gynecology

## 2015-10-25 ENCOUNTER — Encounter (HOSPITAL_COMMUNITY): Payer: Self-pay | Admitting: *Deleted

## 2015-10-25 DIAGNOSIS — Z302 Encounter for sterilization: Secondary | ICD-10-CM

## 2015-10-25 DIAGNOSIS — Z6841 Body Mass Index (BMI) 40.0 and over, adult: Secondary | ICD-10-CM | POA: Diagnosis not present

## 2015-10-25 HISTORY — PX: LAPAROSCOPIC TUBAL LIGATION: SHX1937

## 2015-10-25 LAB — CBC
HCT: 36.9 % (ref 36.0–46.0)
Hemoglobin: 12.1 g/dL (ref 12.0–15.0)
MCH: 27.6 pg (ref 26.0–34.0)
MCHC: 32.8 g/dL (ref 30.0–36.0)
MCV: 84.2 fL (ref 78.0–100.0)
PLATELETS: 341 10*3/uL (ref 150–400)
RBC: 4.38 MIL/uL (ref 3.87–5.11)
RDW: 14.7 % (ref 11.5–15.5)
WBC: 9.8 10*3/uL (ref 4.0–10.5)

## 2015-10-25 LAB — PREGNANCY, URINE: PREG TEST UR: NEGATIVE

## 2015-10-25 SURGERY — LIGATION, FALLOPIAN TUBE, LAPAROSCOPIC
Anesthesia: General | Site: Abdomen | Laterality: Bilateral

## 2015-10-25 MED ORDER — LACTATED RINGERS IV SOLN
INTRAVENOUS | Status: DC
  Administered 2015-10-25: 09:00:00 via INTRAVENOUS

## 2015-10-25 MED ORDER — MIDAZOLAM HCL 2 MG/2ML IJ SOLN
INTRAMUSCULAR | Status: DC | PRN
  Administered 2015-10-25: 1 mg via INTRAVENOUS

## 2015-10-25 MED ORDER — BUPIVACAINE HCL (PF) 0.5 % IJ SOLN
INTRAMUSCULAR | Status: DC | PRN
  Administered 2015-10-25: 30 mL

## 2015-10-25 MED ORDER — FENTANYL CITRATE (PF) 100 MCG/2ML IJ SOLN
INTRAMUSCULAR | Status: AC
  Administered 2015-10-25: 25 ug via INTRAVENOUS
  Filled 2015-10-25: qty 2

## 2015-10-25 MED ORDER — DEXAMETHASONE SODIUM PHOSPHATE 4 MG/ML IJ SOLN
INTRAMUSCULAR | Status: AC
  Filled 2015-10-25: qty 1

## 2015-10-25 MED ORDER — OXYCODONE-ACETAMINOPHEN 5-325 MG PO TABS: 1.0000 | ORAL_TABLET | Freq: Four times a day (QID) | ORAL | 0 refills | Status: AC | PRN

## 2015-10-25 MED ORDER — SCOPOLAMINE 1 MG/3DAYS TD PT72
1.0000 | MEDICATED_PATCH | Freq: Once | TRANSDERMAL | Status: DC
  Administered 2015-10-25: 1.5 mg via TRANSDERMAL

## 2015-10-25 MED ORDER — IBUPROFEN 600 MG PO TABS: 600.0000 mg | ORAL_TABLET | Freq: Four times a day (QID) | ORAL | 3 refills | Status: DC | PRN

## 2015-10-25 MED ORDER — LIDOCAINE HCL (CARDIAC) 20 MG/ML IV SOLN
INTRAVENOUS | Status: DC | PRN
  Administered 2015-10-25: 30 mg via INTRAVENOUS
  Administered 2015-10-25: 70 mg via INTRAVENOUS

## 2015-10-25 MED ORDER — SCOPOLAMINE 1 MG/3DAYS TD PT72
MEDICATED_PATCH | TRANSDERMAL | Status: AC
  Administered 2015-10-25: 1.5 mg via TRANSDERMAL
  Filled 2015-10-25: qty 1

## 2015-10-25 MED ORDER — LIDOCAINE HCL (CARDIAC) 20 MG/ML IV SOLN
INTRAVENOUS | Status: AC
  Filled 2015-10-25: qty 5

## 2015-10-25 MED ORDER — BUPIVACAINE HCL (PF) 0.5 % IJ SOLN
INTRAMUSCULAR | Status: AC
  Filled 2015-10-25: qty 30

## 2015-10-25 MED ORDER — SUCCINYLCHOLINE CHLORIDE 200 MG/10ML IV SOSY
PREFILLED_SYRINGE | INTRAVENOUS | Status: AC
  Filled 2015-10-25: qty 10

## 2015-10-25 MED ORDER — SUGAMMADEX SODIUM 200 MG/2ML IV SOLN
INTRAVENOUS | Status: DC | PRN
  Administered 2015-10-25: 225 mg via INTRAVENOUS

## 2015-10-25 MED ORDER — MIDAZOLAM HCL 2 MG/2ML IJ SOLN
INTRAMUSCULAR | Status: AC
  Filled 2015-10-25: qty 2

## 2015-10-25 MED ORDER — ONDANSETRON HCL 4 MG/2ML IJ SOLN
INTRAMUSCULAR | Status: DC | PRN
  Administered 2015-10-25: 4 mg via INTRAVENOUS

## 2015-10-25 MED ORDER — ONDANSETRON HCL 4 MG/2ML IJ SOLN
INTRAMUSCULAR | Status: AC
  Filled 2015-10-25: qty 2

## 2015-10-25 MED ORDER — ROCURONIUM BROMIDE 100 MG/10ML IV SOLN
INTRAVENOUS | Status: DC | PRN
  Administered 2015-10-25: 35 mg via INTRAVENOUS

## 2015-10-25 MED ORDER — PROPOFOL 10 MG/ML IV BOLUS
INTRAVENOUS | Status: AC
  Filled 2015-10-25: qty 20

## 2015-10-25 MED ORDER — DOCUSATE SODIUM 100 MG PO CAPS
100.0000 mg | ORAL_CAPSULE | Freq: Two times a day (BID) | ORAL | Status: DC | PRN
  Filled 2015-10-25: qty 1

## 2015-10-25 MED ORDER — ROCURONIUM BROMIDE 100 MG/10ML IV SOLN
INTRAVENOUS | Status: AC
  Filled 2015-10-25: qty 1

## 2015-10-25 MED ORDER — FENTANYL CITRATE (PF) 250 MCG/5ML IJ SOLN
INTRAMUSCULAR | Status: AC
  Filled 2015-10-25: qty 5

## 2015-10-25 MED ORDER — KETOROLAC TROMETHAMINE 30 MG/ML IJ SOLN
INTRAMUSCULAR | Status: AC
  Filled 2015-10-25: qty 1

## 2015-10-25 MED ORDER — DEXAMETHASONE SODIUM PHOSPHATE 10 MG/ML IJ SOLN
INTRAMUSCULAR | Status: DC | PRN
  Administered 2015-10-25: 4 mg via INTRAVENOUS

## 2015-10-25 MED ORDER — FENTANYL CITRATE (PF) 100 MCG/2ML IJ SOLN
INTRAMUSCULAR | Status: DC | PRN
  Administered 2015-10-25 (×3): 50 ug via INTRAVENOUS

## 2015-10-25 MED ORDER — KETOROLAC TROMETHAMINE 30 MG/ML IJ SOLN
INTRAMUSCULAR | Status: DC | PRN
  Administered 2015-10-25: 30 mg via INTRAVENOUS

## 2015-10-25 MED ORDER — PROPOFOL 10 MG/ML IV BOLUS
INTRAVENOUS | Status: DC | PRN
  Administered 2015-10-25: 170 mg via INTRAVENOUS

## 2015-10-25 MED ORDER — SUGAMMADEX SODIUM 200 MG/2ML IV SOLN
INTRAVENOUS | Status: AC
  Filled 2015-10-25: qty 2

## 2015-10-25 MED ORDER — FENTANYL CITRATE (PF) 100 MCG/2ML IJ SOLN
25.0000 ug | INTRAMUSCULAR | Status: DC | PRN
  Administered 2015-10-25: 50 ug via INTRAVENOUS
  Administered 2015-10-25 (×2): 25 ug via INTRAVENOUS

## 2015-10-25 MED ORDER — PROMETHAZINE HCL 25 MG/ML IJ SOLN: 6.2500 mg | INTRAMUSCULAR | Status: DC | PRN

## 2015-10-25 SURGICAL SUPPLY — 23 items
CATH ROBINSON RED A/P 16FR (CATHETERS) ×3 IMPLANT
CLIP FILSHIE TUBAL LIGA STRL (Clip) ×3 IMPLANT
CLOTH BEACON ORANGE TIMEOUT ST (SAFETY) ×3 IMPLANT
DRSG OPSITE POSTOP 3X4 (GAUZE/BANDAGES/DRESSINGS) ×3 IMPLANT
DURAPREP 26ML APPLICATOR (WOUND CARE) ×3 IMPLANT
GLOVE BIOGEL PI IND STRL 7.0 (GLOVE) ×3 IMPLANT
GLOVE BIOGEL PI INDICATOR 7.0 (GLOVE) ×6
GLOVE ECLIPSE 7.0 STRL STRAW (GLOVE) ×3 IMPLANT
GOWN STRL REUS W/TWL LRG LVL3 (GOWN DISPOSABLE) ×6 IMPLANT
LIQUID BAND (GAUZE/BANDAGES/DRESSINGS) ×3 IMPLANT
NEEDLE INSUFFLATION 120MM (ENDOMECHANICALS) IMPLANT
PACK LAPAROSCOPY BASIN (CUSTOM PROCEDURE TRAY) ×3 IMPLANT
PACK TRENDGUARD 450 HYBRID PRO (MISCELLANEOUS) IMPLANT
PACK TRENDGUARD 600 HYBRD PROC (MISCELLANEOUS) IMPLANT
PROTECTOR NERVE ULNAR (MISCELLANEOUS) ×6 IMPLANT
SUT VIC AB 3-0 X1 27 (SUTURE) ×3 IMPLANT
SUT VICRYL 0 UR6 27IN ABS (SUTURE) ×3 IMPLANT
SYR 30ML LL (SYRINGE) ×3 IMPLANT
TOWEL OR 17X24 6PK STRL BLUE (TOWEL DISPOSABLE) ×6 IMPLANT
TRENDGUARD 450 HYBRID PRO PACK (MISCELLANEOUS)
TRENDGUARD 600 HYBRID PROC PK (MISCELLANEOUS) ×3
TROCAR 12M 150ML BLUNT (TROCAR) ×2 IMPLANT
TROCAR XCEL NON-BLD 11X100MML (ENDOMECHANICALS) ×3 IMPLANT

## 2015-10-25 NOTE — Anesthesia Postprocedure Evaluation (Signed)
Anesthesia Post Note  Patient: Helen Munoz  Procedure(s) Performed: Procedure(s) (LRB): LAPAROSCOPIC TUBAL LIGATION (Bilateral)  Patient location during evaluation: PACU Anesthesia Type: General Level of consciousness: awake and alert Pain management: pain level controlled Vital Signs Assessment: post-procedure vital signs reviewed and stable Respiratory status: spontaneous breathing, nonlabored ventilation and respiratory function stable Cardiovascular status: blood pressure returned to baseline and stable Postop Assessment: no signs of nausea or vomiting Anesthetic complications: no     Last Vitals:  Vitals:   10/25/15 1200 10/25/15 1223  BP: 103/60   Pulse: 67 69  Resp: 13 12  Temp:  36.7 C    Last Pain:  Vitals:   10/25/15 1223  TempSrc: Oral  PainSc:    Pain Goal: Patients Stated Pain Goal: 4 (10/25/15 0815)               Linton RumpJennifer Dickerson Jasira Robinson

## 2015-10-25 NOTE — Anesthesia Procedure Notes (Signed)
Procedure Name: Intubation Date/Time: 10/25/2015 9:40 AM Performed by: Suella GroveMOORE, Mariyam Remington C Pre-anesthesia Checklist: Patient identified, Patient being monitored, Emergency Drugs available, Timeout performed and Suction available Patient Re-evaluated:Patient Re-evaluated prior to inductionOxygen Delivery Method: Circle system utilized and Simple face mask Preoxygenation: Pre-oxygenation with 100% oxygen Intubation Type: IV induction Ventilation: Mask ventilation without difficulty Laryngoscope Size: Mac and 3 Grade View: Grade II Tube type: Oral Tube size: 7.0 mm Number of attempts: 1 Airway Equipment and Method: Stylet Placement Confirmation: ETT inserted through vocal cords under direct vision,  positive ETCO2 and breath sounds checked- equal and bilateral Secured at: 22 cm Tube secured with: Tape Dental Injury: Teeth and Oropharynx as per pre-operative assessment

## 2015-10-25 NOTE — Transfer of Care (Signed)
Immediate Anesthesia Transfer of Care Note  Patient: Helen Munoz  Procedure(s) Performed: Procedure(s) with comments: LAPAROSCOPIC TUBAL LIGATION (Bilateral) - Filshie clips used  Patient Location: PACU  Anesthesia Type:General  Level of Consciousness: awake, alert , oriented and patient cooperative  Airway & Oxygen Therapy: Patient Spontanous Breathing and Patient connected to nasal cannula oxygen  Post-op Assessment: Report given to RN and Post -op Vital signs reviewed and stable  Post vital signs: Reviewed and stable  Last Vitals:  Vitals:   10/25/15 0815  BP: 118/73  Pulse: 87  Resp: 18  Temp: 36.6 C    Last Pain:  Vitals:   10/25/15 0815  TempSrc: Oral      Patients Stated Pain Goal: 4 (10/25/15 0815)  Complications: No apparent anesthesia complications

## 2015-10-25 NOTE — Discharge Instructions (Signed)
DISCHARGE INSTRUCTIONS: Laparoscopy  The following instructions have been prepared to help you care for yourself upon your return home today.  Wound care:  Do not get the incision wet for the first 24 hours. The incision should be kept clean and dry.  The Band-Aids or dressings may be removed the day after surgery.  Should the incision become sore, red, and swollen after the first week, check with your doctor.  Personal hygiene:  Shower the day after your procedure.  Activity and limitations:  Do NOT drive or operate any equipment today.  Do NOT lift anything more than 15 pounds for 2-3 weeks after surgery.  Do NOT rest in bed all day.  Walking is encouraged. Walk each day, starting slowly with 5-minute walks 3 or 4 times a day. Slowly increase the length of your walks.  Walk up and down stairs slowly.  Do NOT do strenuous activities, such as golfing, playing tennis, bowling, running, biking, weight lifting, gardening, mowing, or vacuuming for 2-4 weeks. Ask your doctor when it is okay to start.  Diet: Eat a light meal as desired this evening. You may resume your usual diet tomorrow.  Return to work: This is dependent on the type of work you do. For the most part you can return to a desk job within a week of surgery. If you are more active at work, please discuss this with your doctor.  What to expect after your surgery: You may have a slight burning sensation when you urinate on the first day. You may have a very small amount of blood in the urine. Expect to have a small amount of vaginal discharge/light bleeding for 1-2 weeks. It is not unusual to have abdominal soreness and bruising for up to 2 weeks. You may be tired and need more rest for about 1 week. You may experience shoulder pain for 24-72 hours. Lying flat in bed may relieve it.  Call your doctor for any of the following:  Develop a fever of 100.4 or greater  Inability to urinate 6 hours after discharge from  hospital  Severe pain not relieved by pain medications  Persistent of heavy bleeding at incision site  Redness or swelling around incision site after a week  Increasing nausea or vomiting  Patient Signature________________________________________ Nurse Signature_________________________________________Laparoscopic Tubal Ligation, Care After Refer to this sheet in the next few weeks. These instructions provide you with information about caring for yourself after your procedure. Your health care provider may also give you more specific instructions. Your treatment has been planned according to current medical practices, but problems sometimes occur. Call your health care provider if you have any problems or questions after your procedure. WHAT TO EXPECT AFTER THE PROCEDURE After your procedure, it is common to have:  Sore throat.  Soreness at the incision site.  Mild cramping.  Tiredness.  Mild nausea or vomiting.  Shoulder pain. HOME CARE INSTRUCTIONS  Rest for the remainder of the day.  Take medicines only as directed by your health care provider. These include over-the-counter medicines and prescription medicines. Do not take aspirin, which can cause bleeding.  Over the next few days, gradually return to your normal activities and your normal diet.  Avoid sexual intercourse for 2 weeks or as directed by your health care provider.  Do not use tampons, and do not douche.  Do not drive or operate heavy machinery while taking pain medicine.  Do not lift anything that is heavier than 5 lb (2.3 kg) for 2  weeks or as directed by your health care provider.  Do not take baths. Take showers only. Ask your health care provider when you can start taking baths.  Take your temperature twice each day and write it down.  Try to have help for your household needs for the first 7-10 days.  There are many different ways to close and cover an incision, including stitches (sutures), skin  glue, and adhesive strips. Follow instructions from your health care provider about:  Incision care.  Bandage (dressing) changes and removal.  Incision closure removal.  Check your incision area every day for signs of infection. Watch for:  Redness, swelling, or pain.  Fluid, blood, or pus.  Keep all follow-up visits as directed by your health care provider. SEEK MEDICAL CARE IF:  You have redness, swelling, or increasing pain in your incision area.  You have fluid, blood, or pus coming from your incision for longer than 1 day.  You notice a bad smell coming from your incision or your dressing.  The edges of your incision break open after the sutures have been removed.  Your pain does not decrease after 2-3 days.  You have a rash.  You repeatedly become dizzy or light-headed.  You have a reaction to your medicine.  Your pain medicine is not helping.  You are constipated. SEEK IMMEDIATE MEDICAL CARE IF:  You have a fever.  You faint.  You have increasing pain in your abdomen.  You have severe pain in one or both of your shoulders.  You have bleeding or drainage from your suture sites or your vagina after surgery.  You have shortness of breath or have difficulty breathing.  You have chest pain or leg pain.  You have ongoing nausea, vomiting, or diarrhea.   This information is not intended to replace advice given to you by your health care provider. Make sure you discuss any questions you have with your health care provider.   Document Released: 07/26/2004 Document Revised: 05/23/2014 Document Reviewed: 04/19/2011 Elsevier Interactive Patient Education [employer]

## 2015-10-25 NOTE — Op Note (Signed)
Helen Munoz 10/25/2015  PREOPERATIVE DIAGNOSIS:  Undesired fertility  POSTOPERATIVE DIAGNOSIS:  Undesired fertility  PROCEDURE:  Laparoscopic Bilateral Tubal Sterilization using Filshie Clips   SURGEON: Jaynie CollinsUgonna Yuleidy Rappleye, MD  ANESTHESIA:  General endotracheal  COMPLICATIONS:  None immediate.  ESTIMATED BLOOD LOSS:  5 ml.  FLUIDS: 800 ml LR.  URINE OUTPUT:  150 ml of clear urine.  INDICATIONS: 30 y.o. [id number]  with undesired fertility, desires permanent sterilization. Other reversible forms of contraception were discussed with patient; she declines all other modalities.  Risks of procedure discussed with patient including permanence of method, bleeding, infection, injury to surrounding organs and need for additional procedures including laparotomy, risk of regret.  Failure risk of 0.5-1% with increased risk of ectopic gestation if pregnancy occurs was also discussed with patient.      FINDINGS:  Normal uterus, tubes, and ovaries.  TECHNIQUE:  The patient was taken to the operating room where general anesthesia was obtained without difficulty.  She was then placed in the dorsal lithotomy position and prepared and draped in sterile fashion.  After an adequate timeout was performed, a bivalved speculum was then placed in the patient's vagina, and the anterior lip of cervix grasped with the single-tooth tenaculum.  The uterine manipulator was then advanced into the uterus.  The speculum was removed from the vagina.  Attention was then turned to the patient's abdomen where a 11-mm skin incision was made in the umbilical fold.  The Optiview 11-mm trocar and sleeve were then advanced without difficulty with the laparoscope under direct visualization into the abdomen.  The abdomen was then insufflated with carbon dioxide gas and adequate pneumoperitoneum was obtained.  A survey of the patient's pelvis and abdomen revealed entirely normal anatomy.  The fallopian tubes were observed and found to be  normal in appearance. The Filshie clip applicator was placed through the operative port, and a Filshie clip was placed on the right fallopian tube ,about 2 cm from the cornual attachment, with care given to incorporate the underlying mesosalpinx.  A similar process was carried out on the contralateral side allowing for bilateral tubal sterilization.   Good hemostasis was noted overall.  Local analgesia was drizzled on both operative sites.The instruments were then removed from the patient's abdomen and the fascial incision was repaired with 0 Vicryl, and the skin was closed with Dermabond.  The uterine manipulator and the tenaculum were removed from the vagina without complications. The patient tolerated the procedure well.  Sponge, lap, and needle counts were correct times two.  The patient was then taken to the recovery room awake, extubated and in stable condition.  The patient will be discharged to home as per PACU criteria.  Routine postoperative instructions given.  She was prescribed Percocet, Ibuprofen and Colace.  She will follow up in the clinic on 11/21/15 for postoperative evaluation .  Jaynie CollinsUGONNA  Salvatore Poe, MD, FACOG Attending Obstetrician & Gynecologist, [facility], [facility]
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
## 2015-10-25 NOTE — Anesthesia Preprocedure Evaluation (Signed)
Anesthesia Evaluation  Patient identified by MRN, date of birth, ID band Patient awake    Reviewed: Allergy & Precautions, NPO status , Patient's Chart, lab work & pertinent test results  History of Anesthesia Complications Negative for: history of anesthetic complications  Airway Mallampati: I  TM Distance: >3 FB Neck ROM: Full    Dental  (+) Teeth Intact, Caps, Dental Advisory Given,    Pulmonary asthma (childhood) ,    Pulmonary exam normal breath sounds clear to auscultation       Cardiovascular negative cardio ROS   Rhythm:Regular Rate:Normal     Neuro/Psych  Headaches,    GI/Hepatic negative GI ROS, Neg liver ROS,   Endo/Other  neg diabetesMorbid obesity  Renal/GU negative Renal ROS     Musculoskeletal   Abdominal (+) + obese,   Peds  Hematology negative hematology ROS (+)   Anesthesia Other Findings H/o positive PPD s/p 2 months of rifampin  Reproductive/Obstetrics                            Anesthesia Physical Anesthesia Plan  ASA: III  Anesthesia Plan: General   Post-op Pain Management:    Induction: Intravenous  Airway Management Planned: Oral ETT  Additional Equipment:   Intra-op Plan:   Post-operative Plan: Extubation in OR  Informed Consent: I have reviewed the patients History and Physical, chart, labs and discussed the procedure including the risks, benefits and alternatives for the proposed anesthesia with the patient or authorized representative who has indicated his/her understanding and acceptance.   Dental advisory given  Plan Discussed with: CRNA  Anesthesia Plan Comments: (Risks of general anesthesia discussed including, but not limited to, sore throat, hoarse voice, chipped/damaged teeth, injury to vocal cords, nausea and vomiting, allergic reactions, lung infection, heart attack, stroke, and death. All questions answered. )        Anesthesia  Quick Evaluation

## 2015-10-29 ENCOUNTER — Encounter (HOSPITAL_COMMUNITY): Payer: Self-pay | Admitting: Obstetrics & Gynecology

## 2015-10-29 NOTE — Addendum Note (Signed)
Addendum  created 10/29/15 1327 by Randa SpikeMyra D Marciano Mundt, CRNA   Charge Capture section accepted

## 2015-11-09 ENCOUNTER — Encounter: Payer: Self-pay | Admitting: *Deleted

## 2015-11-21 ENCOUNTER — Ambulatory Visit: Payer: Medicaid Other | Admitting: Obstetrics & Gynecology

## 2016-06-03 IMAGING — US US OB TRANSVAGINAL
1 series · 15 of 28 positions shown · non-contrast
Comparison: None.

CLINICAL DATA: Vaginal bleeding during pregnancy, first trimester.

EXAM:
OBSTETRIC <14 WK US AND TRANSVAGINAL OB US
TECHNIQUE: Both transabdominal and transvaginal ultrasound examinations were
performed for complete evaluation of the gestation as well as the
maternal uterus, adnexal regions, and pelvic cul-de-sac.
Transvaginal technique was performed to assess early pregnancy.

[Series 1: us ob transvaginal · 15 of 38 slices shown]
[im 1/38]
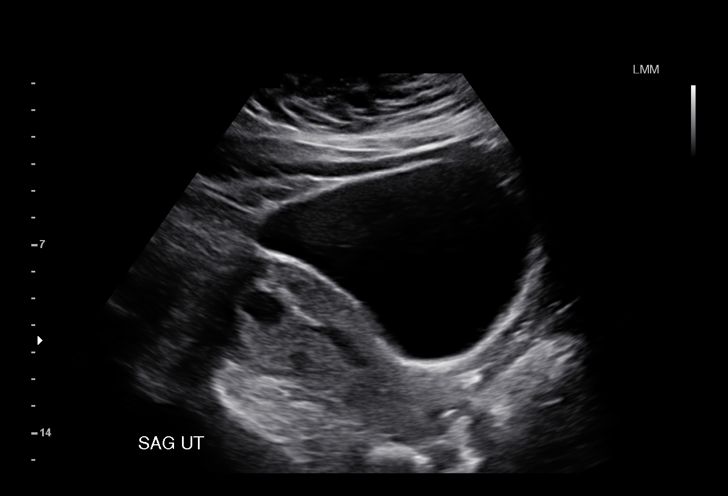
[im 3/38]
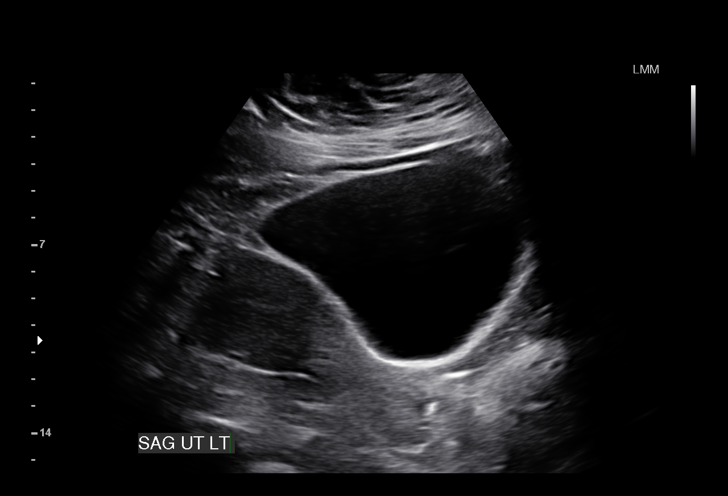
[im 6/38]
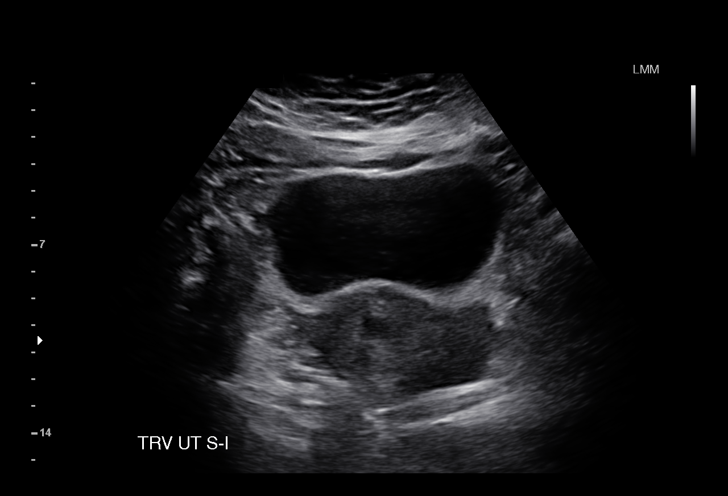
[im 9/38]
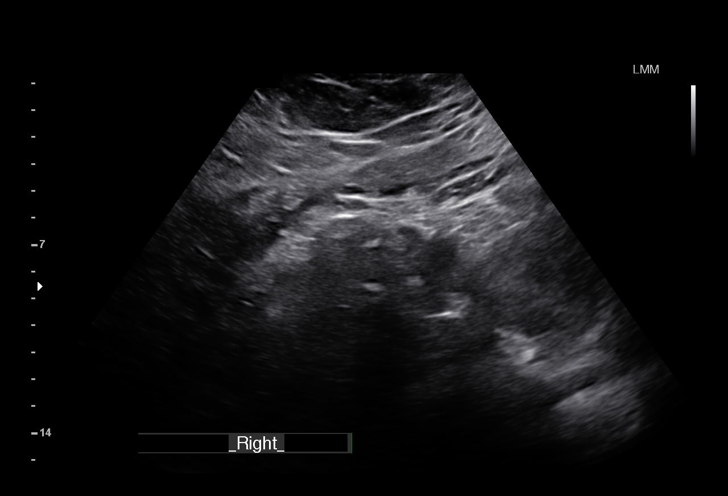
[im 11/38]
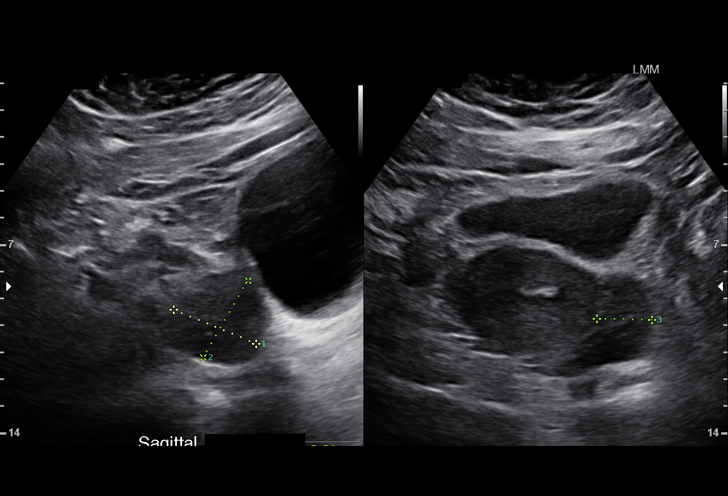
[im 14/38]
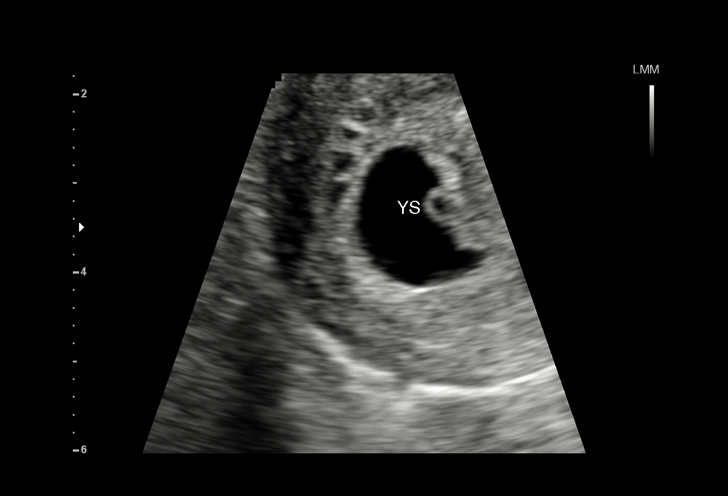
[im 17/38]
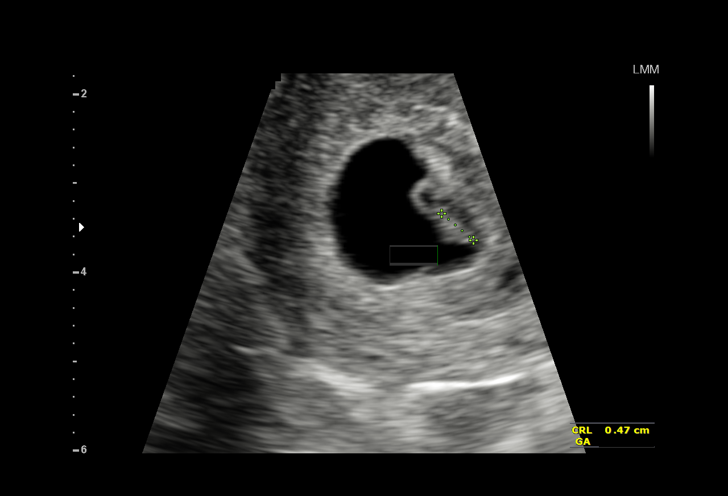
[im 20/38]
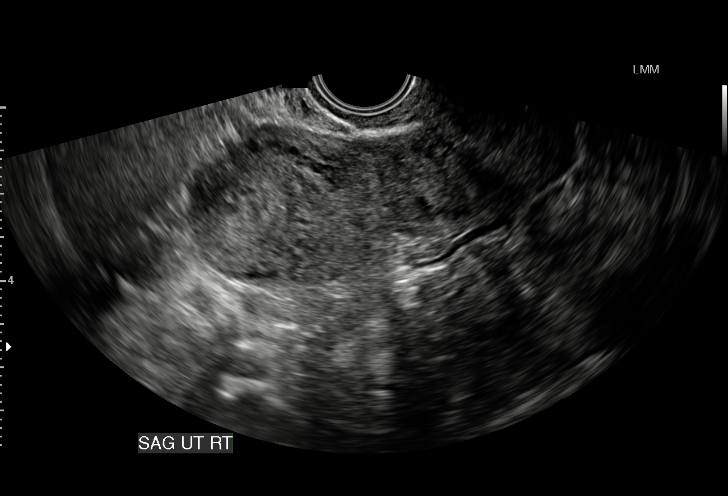
[im 21/38]
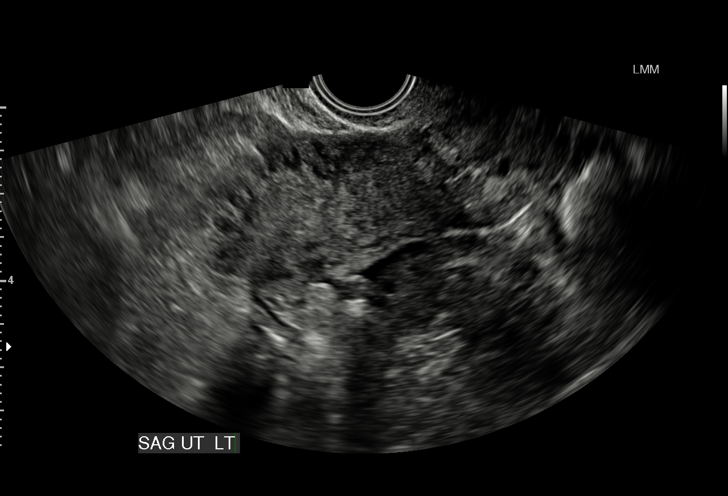
[im 24/38]
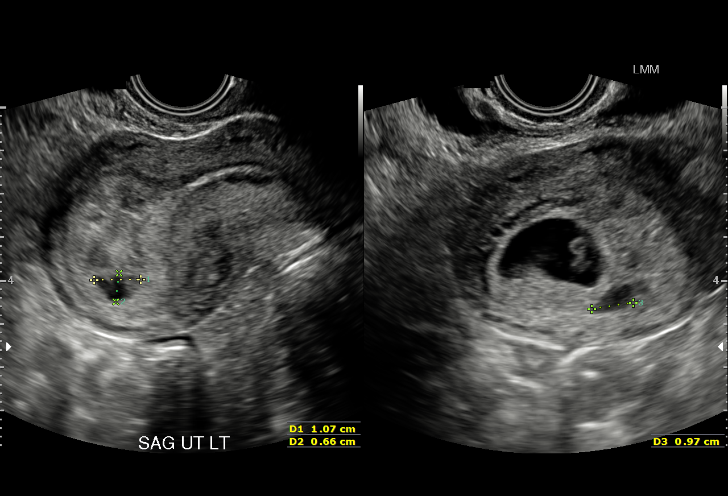
[im 27/38]
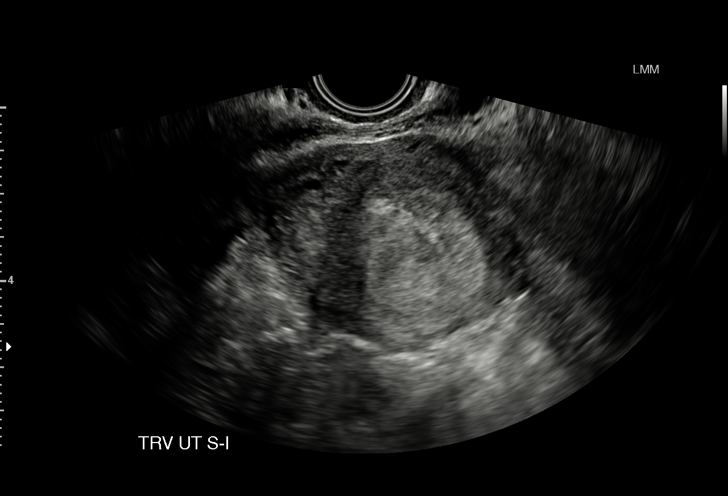
[im 29/38]
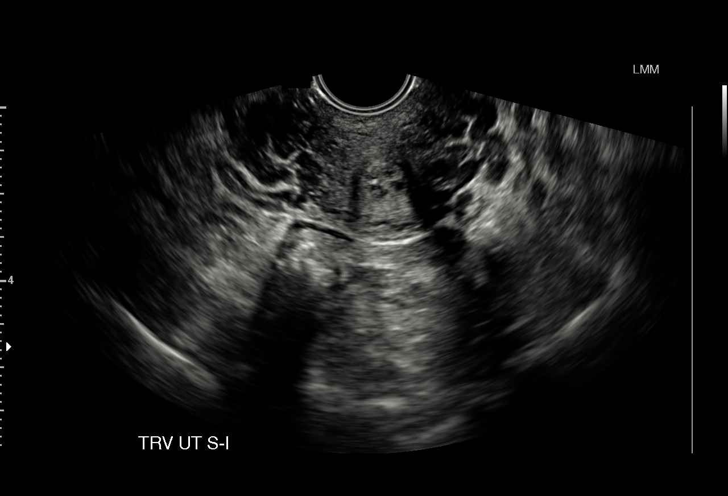
[im 32/38]
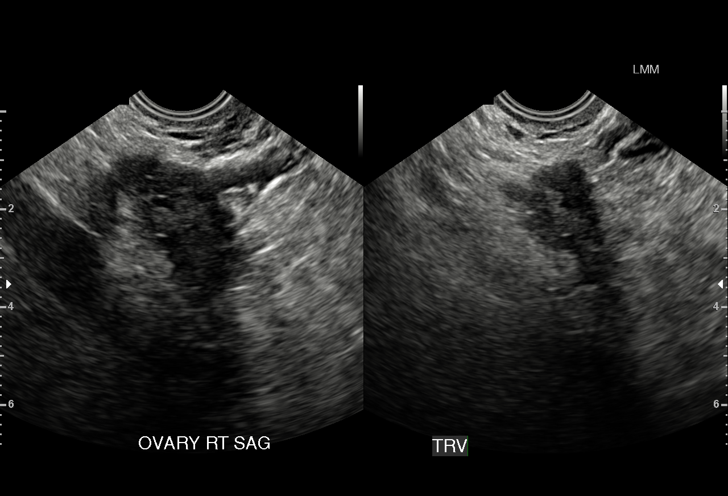
[im 35/38]
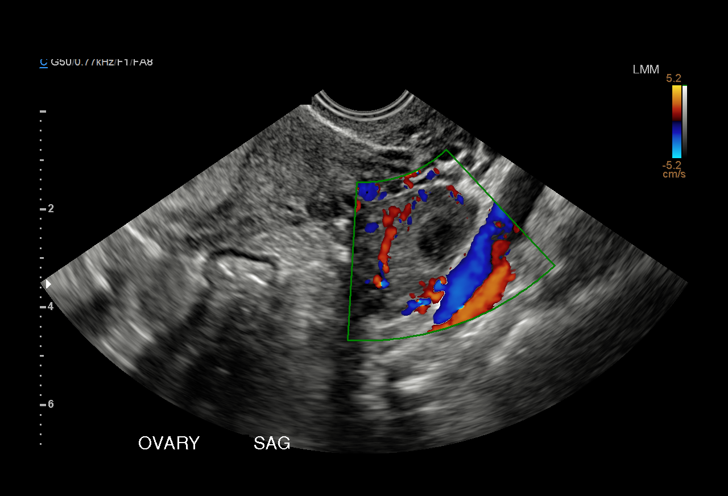
[im 38/38]
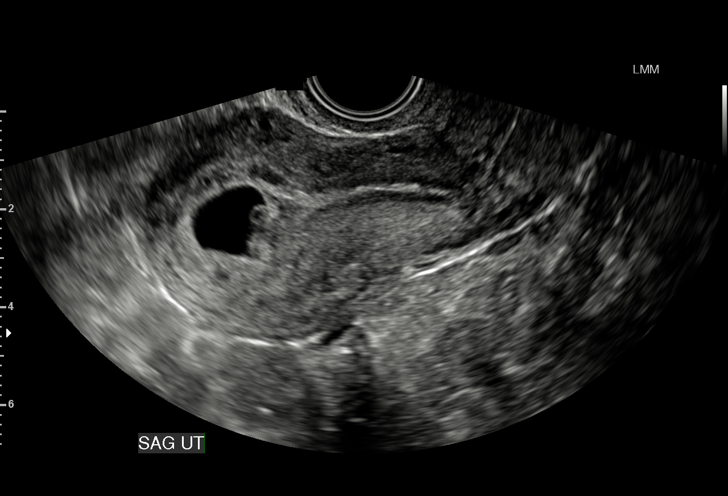

[15 of 28 positions shown; findings below may reference images not displayed]

FINDINGS: Intrauterine gestational sac: Visualized/normal in shape.

Yolk sac:  Present

Embryo:  Present

Cardiac Activity: Present

Heart Rate: 118  bpm

CRL:  5  mm   6 w   1 d                  US EDC: 06/30/2015

Maternal uterus/adnexae: Small volume subchorionic hemorrhage
identified posteriorly. On the order of 1.1 x 0.7 x 1.1 cm.

Normal right ovary. A left ovarian corpus luteal cyst is identified.
No significant free fluid.
IMPRESSION: 1. Intrauterine gestational of approximately 6 weeks 1 days with
fetal heart rate of 118 beats per min.
2. Small subchorionic hemorrhage.
3. Left ovarian corpus luteal cyst.

## 2016-09-28 IMAGING — US US MFM OB FOLLOW-UP
1 series · 12 of 28 positions shown · non-contrast
Comparison: none

[Series 1: us mfm ob follow-up · 0.18mm/px · 47 acquisitions, 12 frames shown]
[im 2/47]
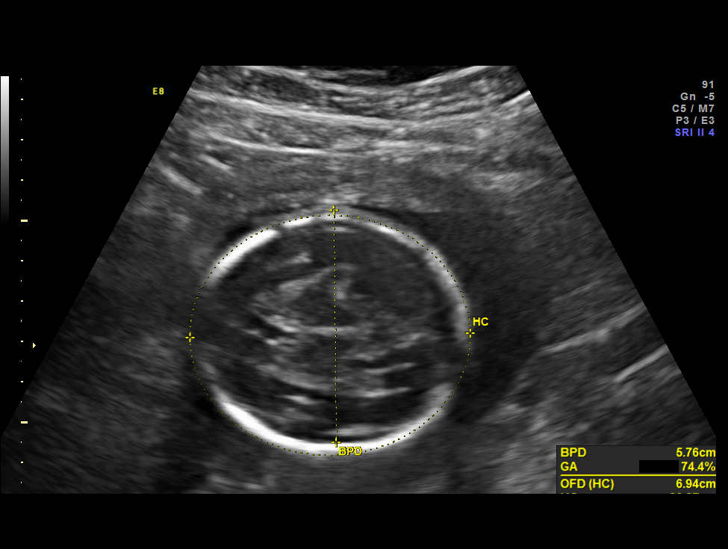
[im 6/47]
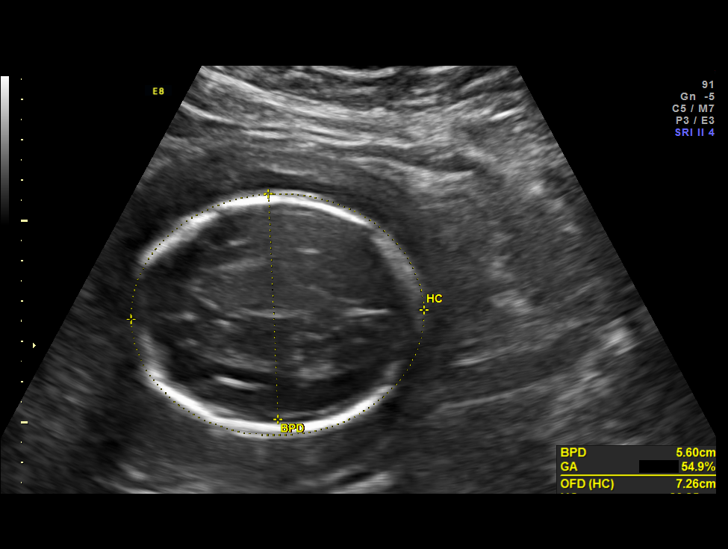
[im 9/47]
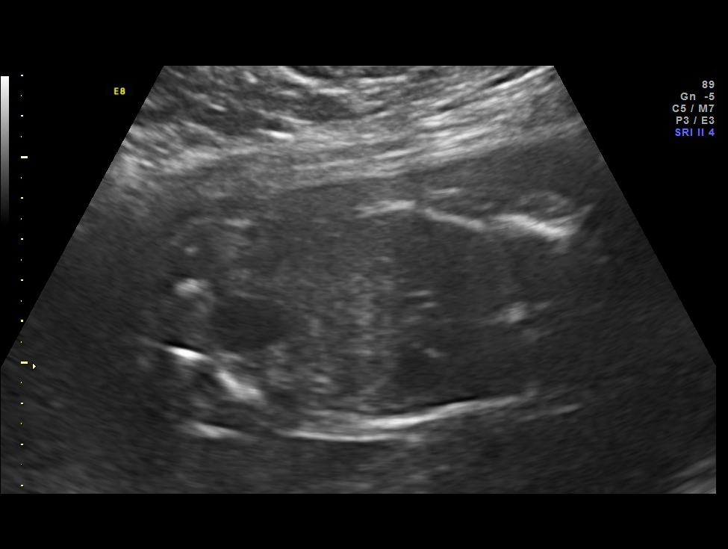
[im 14/47]
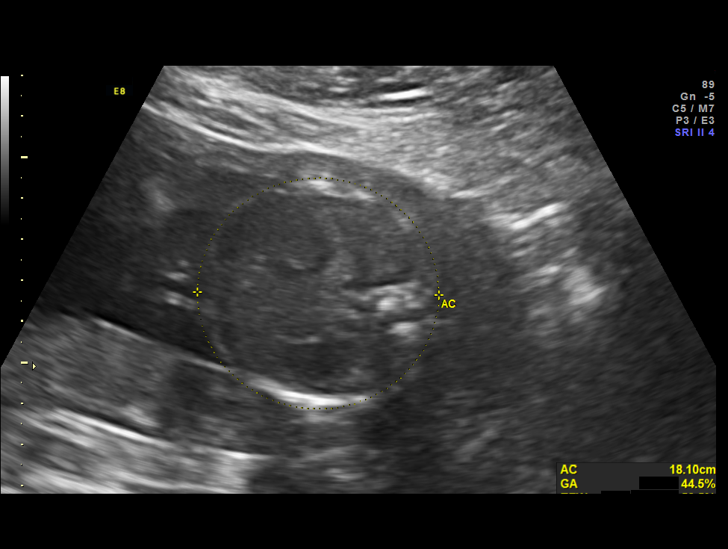
[im 18/47]
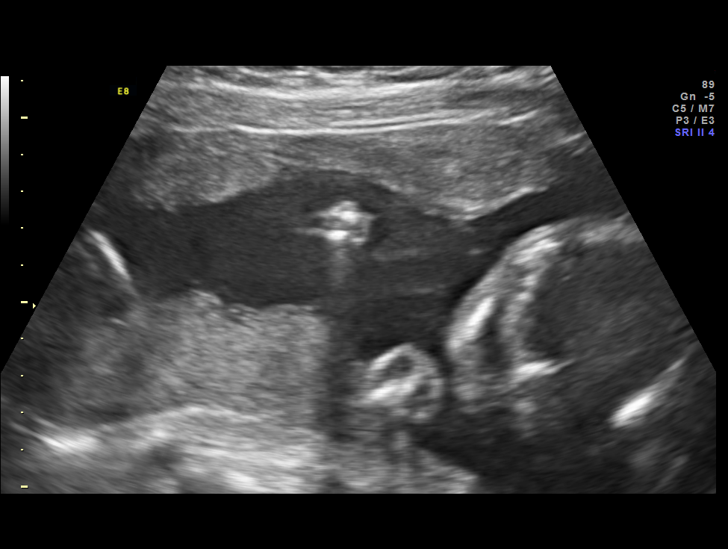
[im 21/47]
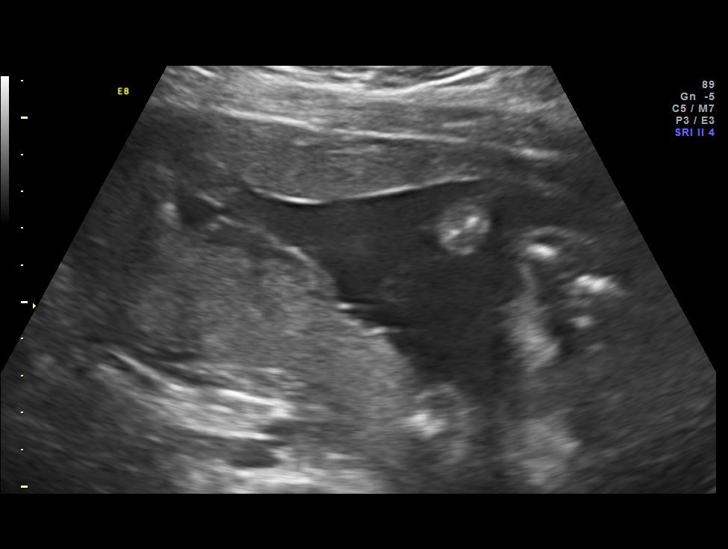
[im 26/47]
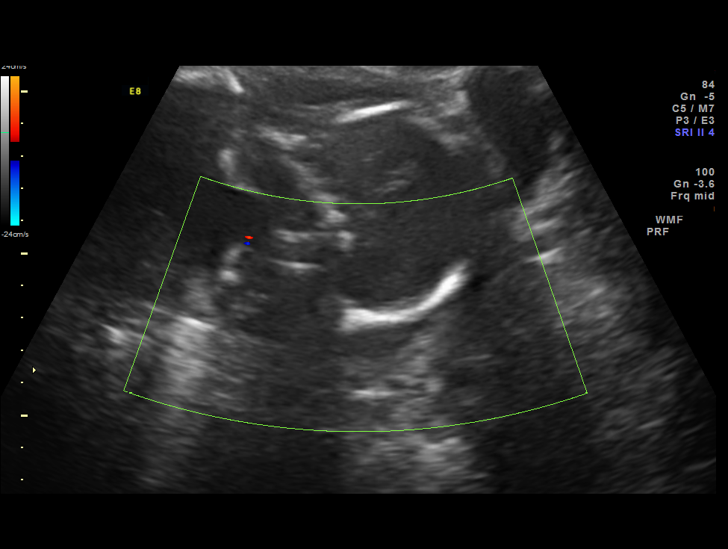
[im 29/47]
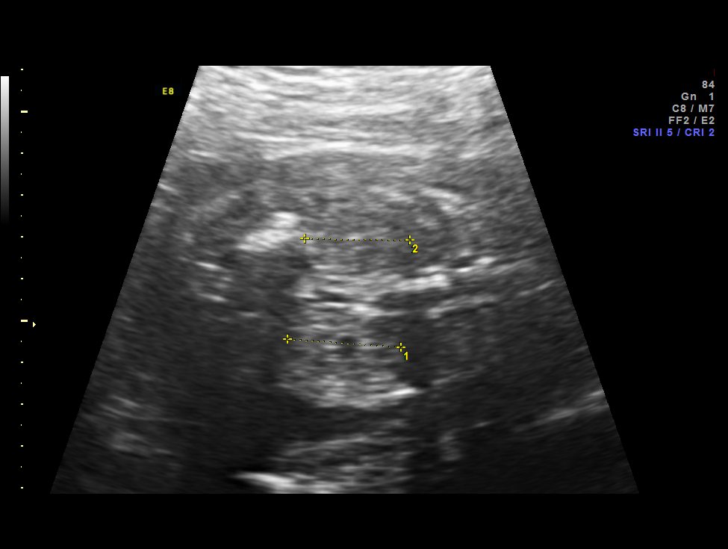
[im 33/47]
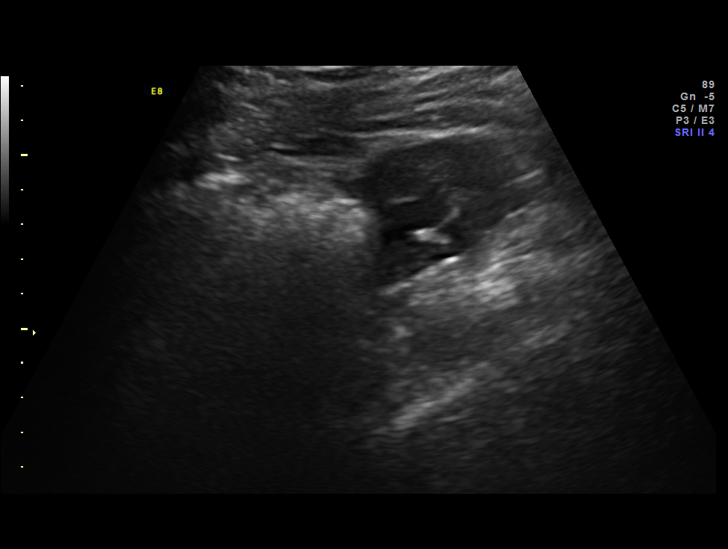
[im 38/47]
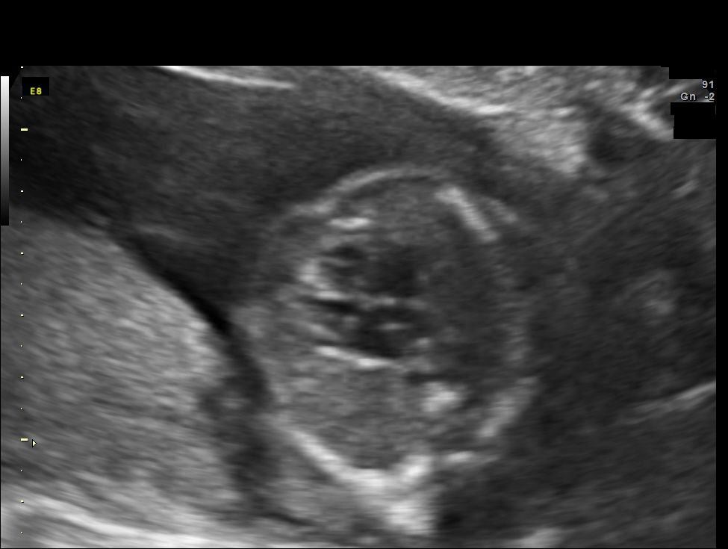
[im 41/47]
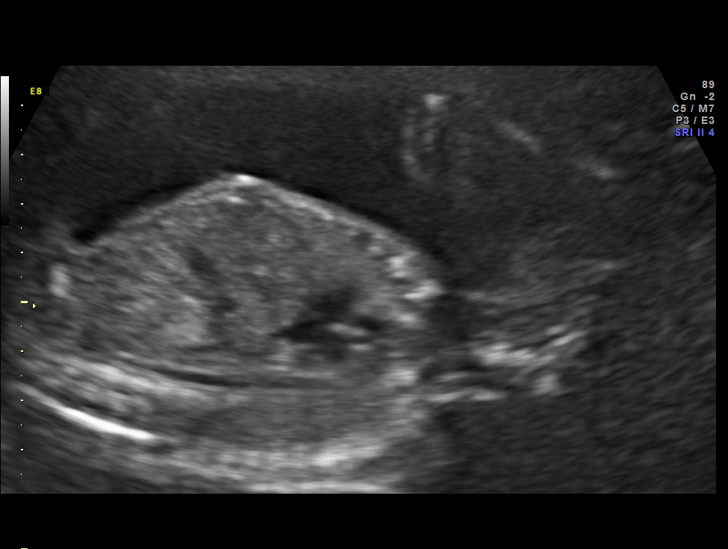
[im 45/47]
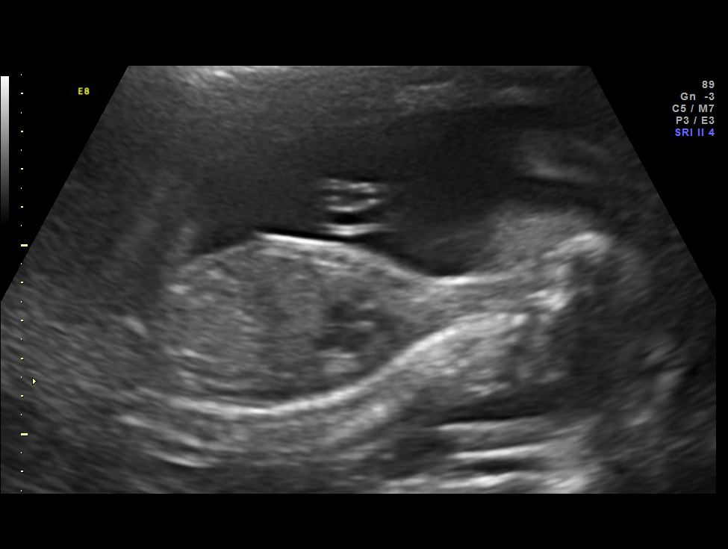

[12 of 28 positions shown; findings below may reference images not displayed]

pm)

Date:

Phy.:              [REDACTED]-
Faculty Physician

1  BUBU TUTOR            [phone]       [id number]     [phone]
Indications

22 weeks gestation of pregnancy
Follow-up incomplete fetal anatomic             Z36
evaluation
Maternal morbid obesity
OB History

Height:        5'5"   Weight:   255        BMI:
Gravidity:     3         Term:  2        Prem:    0        SAB:   0
TOP:           0       Ectopic  0        Living:  2
:
Fetal Evaluation

Num Of Fetuses:      1
Fetal Heart          138
Rate(bpm):
Cardiac Activity:    Observed
Presentation:        Cephalic
Placenta:            Posterior Fundal, above cervical os
P. Cord Insertion:   Previously Visualized
Amniotic Fluid
AFI FV:      Subjectively within normal limits
Larg Pckt:      3.8  cm
Biometry

BPD:        57  mm     G. Age:   23w 3d                  CI:         81.4   %    70 - 86
OFD:        70  mm                                       FL/HC:      22.1   %    19.2 -
HC:      203.6  mm     G. Age:   22w 3d        23   %    HC/AC:      1.11        1.05 -
AC:      183.8  mm     G. Age:   23w 1d        53   %    FL/BPD      78.8   %    71 - 87
:
FL:       44.9  mm     G. Age:   24w 6d        91   %    FL/AC:      24.4   %    20 - 24
HUM:      41.9  mm     G. Age:   25w 2d      > 95   %

Est.         625   gm    1 lb 6 oz      66   %
FW:
Gestational Age

U/S Today:     23w 3d                                         EDD:   06/26/15
Best:          22w 6d    Det. By:   Early Ultrasound          EDD:   06/30/15
(11/05/14)
Anatomy

Cranium:          Appears normal         Aortic Arch:       Appears normal
Fetal Cavum:      Appears normal         Ductal Arch:       Appears normal
Ventricles:       Appears normal         Diaphragm:         Appears normal
Choroid Plexus:   Previously seen        Stomach:           Appears normal,
left sided
Cerebellum:       Appears normal         Abdomen:           Appears normal
Posterior         Appears normal         Abdominal          Previously seen
Fossa:                                   Wall:
Nuchal Fold:      Previously seen        Cord Vessels:      Appears normal (3
vessel cord)
Face:             Orbits nl; profile     Kidneys:           Appear normal
not well visualized
Lips:             Not well visualized    Bladder:           Appears normal
Fetal Thoracic:   Appears normal         Spine:             Previously seen
Heart:            Appears normal         Upper              Previously seen
(4CH, axis, and        Extremities:
situs)
RVOT:             Appears normal         Lower              Previously seen
Extremities:
LVOT:             Not well visualized

Other:   Fetus appears to be a female. Technically difficult due to maternal
habitus.
Cervix Uterus Adnexa

Cervix
Length:             3.6  cm.
Normal appearance by transabdominal scan.
Left Ovary
Not visualized. No adnexal mass visualized.

Right Ovary
Not visualized. No adnexal mass visualized.
Impression

SIUP at 22+6 weeks
Normal interval anatomy; anatomic survey complete except
for face and LVOT
Normal amniotic fluid volume
Appropriate interval growth with EFW at the 66th %tile

---------------------------------------------------------------------- Recommendations

Follow-up ultrasound for growth in 6-8 weeks

## 2016-10-24 ENCOUNTER — Other Ambulatory Visit: Payer: Self-pay | Admitting: Obstetrics & Gynecology
